# Patient Record
Sex: Female | Born: 1965 | Race: White | Hispanic: No | Marital: Married | Smoking: Never smoker
Health system: Southern US, Community
[De-identification: ages and names within clinical notes are randomized; demographics above are authoritative.]

## PROBLEM LIST (undated history)

## (undated) DIAGNOSIS — R600 Localized edema: Secondary | ICD-10-CM

## (undated) DIAGNOSIS — E669 Obesity, unspecified: Secondary | ICD-10-CM

## (undated) DIAGNOSIS — M2550 Pain in unspecified joint: Secondary | ICD-10-CM

## (undated) DIAGNOSIS — F419 Anxiety disorder, unspecified: Secondary | ICD-10-CM

## (undated) DIAGNOSIS — E162 Hypoglycemia, unspecified: Secondary | ICD-10-CM

## (undated) DIAGNOSIS — I341 Nonrheumatic mitral (valve) prolapse: Secondary | ICD-10-CM

## (undated) DIAGNOSIS — R079 Chest pain, unspecified: Secondary | ICD-10-CM

## (undated) DIAGNOSIS — I10 Essential (primary) hypertension: Secondary | ICD-10-CM

## (undated) DIAGNOSIS — M1990 Unspecified osteoarthritis, unspecified site: Secondary | ICD-10-CM

## (undated) DIAGNOSIS — E079 Disorder of thyroid, unspecified: Secondary | ICD-10-CM

## (undated) DIAGNOSIS — E559 Vitamin D deficiency, unspecified: Secondary | ICD-10-CM

## (undated) DIAGNOSIS — G4730 Sleep apnea, unspecified: Secondary | ICD-10-CM

## (undated) DIAGNOSIS — K829 Disease of gallbladder, unspecified: Secondary | ICD-10-CM

## (undated) DIAGNOSIS — C801 Malignant (primary) neoplasm, unspecified: Secondary | ICD-10-CM

## (undated) DIAGNOSIS — M47816 Spondylosis without myelopathy or radiculopathy, lumbar region: Secondary | ICD-10-CM

## (undated) HISTORY — DX: Disorder of thyroid, unspecified: E07.9

## (undated) HISTORY — PX: BREAST SURGERY: SHX581

## (undated) HISTORY — DX: Nonrheumatic mitral (valve) prolapse: I34.1

## (undated) HISTORY — PX: ABDOMINAL HYSTERECTOMY: SHX81

## (undated) HISTORY — DX: Malignant (primary) neoplasm, unspecified: C80.1

## (undated) HISTORY — DX: Unspecified osteoarthritis, unspecified site: M19.90

## (undated) HISTORY — DX: Chest pain, unspecified: R07.9

## (undated) HISTORY — DX: Sleep apnea, unspecified: G47.30

## (undated) HISTORY — DX: Anxiety disorder, unspecified: F41.9

## (undated) HISTORY — DX: Pain in unspecified joint: M25.50

## (undated) HISTORY — DX: Essential (primary) hypertension: I10

## (undated) HISTORY — DX: Disease of gallbladder, unspecified: K82.9

## (undated) HISTORY — PX: CHOLECYSTECTOMY: SHX55

## (undated) HISTORY — DX: Vitamin D deficiency, unspecified: E55.9

## (undated) HISTORY — DX: Obesity, unspecified: E66.9

## (undated) HISTORY — DX: Localized edema: R60.0

## (undated) HISTORY — DX: Hypoglycemia, unspecified: E16.2

---
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
## 2010-12-22 DIAGNOSIS — K8020 Calculus of gallbladder without cholecystitis without obstruction: Secondary | ICD-10-CM | POA: Insufficient documentation

## 2015-01-08 DIAGNOSIS — R5383 Other fatigue: Secondary | ICD-10-CM | POA: Insufficient documentation

## 2015-01-08 DIAGNOSIS — N951 Menopausal and female climacteric states: Secondary | ICD-10-CM | POA: Insufficient documentation

## 2015-01-08 DIAGNOSIS — E039 Hypothyroidism, unspecified: Secondary | ICD-10-CM | POA: Insufficient documentation

## 2015-01-08 DIAGNOSIS — Z6841 Body Mass Index (BMI) 40.0 and over, adult: Secondary | ICD-10-CM | POA: Insufficient documentation

## 2015-01-08 DIAGNOSIS — F988 Other specified behavioral and emotional disorders with onset usually occurring in childhood and adolescence: Secondary | ICD-10-CM | POA: Insufficient documentation

## 2015-02-05 DIAGNOSIS — Z2821 Immunization not carried out because of patient refusal: Secondary | ICD-10-CM | POA: Insufficient documentation

## 2016-08-28 ENCOUNTER — Other Ambulatory Visit (HOSPITAL_BASED_OUTPATIENT_CLINIC_OR_DEPARTMENT_OTHER): Payer: Self-pay | Admitting: Orthopedic Surgery

## 2016-08-28 DIAGNOSIS — S86301A Unspecified injury of muscle(s) and tendon(s) of peroneal muscle group at lower leg level, right leg, initial encounter: Secondary | ICD-10-CM

## 2016-08-29 ENCOUNTER — Ambulatory Visit (HOSPITAL_BASED_OUTPATIENT_CLINIC_OR_DEPARTMENT_OTHER)
Admission: RE | Admit: 2016-08-29 | Discharge: 2016-08-29 | Disposition: A | Discharge status: Home / Self Care | Payer: PRIVATE HEALTH INSURANCE | Source: Ambulatory Visit | Attending: Orthopedic Surgery | Admitting: Orthopedic Surgery

## 2016-08-29 DIAGNOSIS — M659 Synovitis and tenosynovitis, unspecified: Secondary | ICD-10-CM | POA: Diagnosis not present

## 2016-08-29 DIAGNOSIS — S86301A Unspecified injury of muscle(s) and tendon(s) of peroneal muscle group at lower leg level, right leg, initial encounter: Secondary | ICD-10-CM

## 2016-08-29 DIAGNOSIS — X58XXXA Exposure to other specified factors, initial encounter: Secondary | ICD-10-CM | POA: Diagnosis not present

## 2016-08-29 DIAGNOSIS — M722 Plantar fascial fibromatosis: Secondary | ICD-10-CM | POA: Insufficient documentation

## 2016-08-29 DIAGNOSIS — S92024A Nondisplaced fracture of anterior process of right calcaneus, initial encounter for closed fracture: Secondary | ICD-10-CM | POA: Diagnosis not present

## 2016-08-29 DIAGNOSIS — S86309A Unspecified injury of muscle(s) and tendon(s) of peroneal muscle group at lower leg level, unspecified leg, initial encounter: Secondary | ICD-10-CM | POA: Diagnosis present

## 2017-04-07 DIAGNOSIS — D0512 Intraductal carcinoma in situ of left breast: Secondary | ICD-10-CM | POA: Insufficient documentation

## 2017-07-29 DIAGNOSIS — M25549 Pain in joints of unspecified hand: Secondary | ICD-10-CM | POA: Insufficient documentation

## 2017-07-29 DIAGNOSIS — Z9189 Other specified personal risk factors, not elsewhere classified: Secondary | ICD-10-CM | POA: Insufficient documentation

## 2018-08-23 ENCOUNTER — Encounter: Payer: Self-pay | Admitting: Sports Medicine

## 2018-08-23 ENCOUNTER — Ambulatory Visit (INDEPENDENT_AMBULATORY_CARE_PROVIDER_SITE_OTHER): Payer: PRIVATE HEALTH INSURANCE | Admitting: Sports Medicine

## 2018-08-23 DIAGNOSIS — R29818 Other symptoms and signs involving the nervous system: Secondary | ICD-10-CM

## 2018-08-23 DIAGNOSIS — M25571 Pain in right ankle and joints of right foot: Secondary | ICD-10-CM | POA: Insufficient documentation

## 2018-08-23 DIAGNOSIS — Z853 Personal history of malignant neoplasm of breast: Secondary | ICD-10-CM | POA: Diagnosis not present

## 2018-08-23 DIAGNOSIS — R251 Tremor, unspecified: Secondary | ICD-10-CM

## 2018-08-23 DIAGNOSIS — E162 Hypoglycemia, unspecified: Secondary | ICD-10-CM

## 2018-08-23 DIAGNOSIS — G8929 Other chronic pain: Secondary | ICD-10-CM

## 2018-08-23 DIAGNOSIS — Z1211 Encounter for screening for malignant neoplasm of colon: Secondary | ICD-10-CM | POA: Insufficient documentation

## 2018-08-23 DIAGNOSIS — Z0000 Encounter for general adult medical examination without abnormal findings: Secondary | ICD-10-CM | POA: Insufficient documentation

## 2018-08-23 DIAGNOSIS — M170 Bilateral primary osteoarthritis of knee: Secondary | ICD-10-CM | POA: Insufficient documentation

## 2018-08-23 NOTE — Assessment & Plan Note (Signed)
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 Lateral heel wedge, continue meloxicam, rehab exercises given, ankle x-rays. Return in 2 to 3 weeks, we will do an peroneal sheath injection versus MRI if no better.

## 2018-08-23 NOTE — Assessment & Plan Note (Addendum)
Unclear etiology, she is not on any medications that could cause hypoglycemia, adding insulin and C-peptide levels, and these episodes were not with fasting.  Insulin levels, C-peptide levels, are all normal but in the upper limit of normal.  Proinsulin is slightly elevated, for this reason I would like endocrinology to weigh in regarding the possibility of an insulinoma considering levels were high normal and she had episodes of symptomatic hypoglycemia.

## 2018-08-23 NOTE — Assessment & Plan Note (Signed)
Had a steroid injection 2 years ago, did well, has never had Visco supplementation. Getting her approved again but this time for Orthovisc, baseline x-rays today. If approved for Orthovisc we would do a steroid and Orthovisc injection together, she tells me she does not need it just yet. Continue meloxicam, rehab exercises given.

## 2018-08-23 NOTE — Assessment & Plan Note (Signed)
Unclear etiology, she does describe episodes where her eyes rolled back, she shakes, she does get a bit sweaty. Has noted hypoglycemia into the 50s during these episodes. We are going to work-up her hyperglycemia but I also think that she needs an evaluation with neurology, with an EEG. She does endorse some new onset word finding difficulties, and does have a history of breast cancer so we will absolutely need a brain MRI with and without contrast.

## 2018-08-23 NOTE — Assessment & Plan Note (Signed)
I think gastric sleeve would be the best option for her.

## 2018-08-23 NOTE — Progress Notes (Addendum)
Subjective:    CC: New patient visit with the below complaints as noted in HPI:  HPI:  Shaking episodes: These occur relatively randomly, sometimes after eating, she endorses that her eyes will roll back in her head, she becomes minimally responsive, no tongue biting, no incontinence.  She gets very sweaty during these episodes.  She has had her blood sugar checked and it was in the 50s.  She does have a history of breast cancer, suspected to be stage I but has not had PET scan or any brain imaging.  In addition she has right ankle pain, she has a history of an ankle fracture, a small avulsion that healed.  More recently she is had worse pain in her ankle behind the lateral malleolus.  Moderate, persistent, localized without radiation.  No mechanical symptoms.  Bilateral knee osteoarthritis: Had a steroid injection 2 years with good relief, not really having a significant amount of pain right now but she is interested in considering Visco supplementation when her pain returns.  Morbid obesity: Is going to think about gastric sleeve.  I reviewed the past medical history, family history, social history, surgical history, and allergies today and no changes were needed.  Please see the problem list section below in epic for further details.  Past Medical History: Past Medical History:  Diagnosis Date  . Cancer ([place])   . Hypertension   . Thyroid disease    Past Surgical History: Past Surgical History:  Procedure Laterality Date  . ABDOMINAL HYSTERECTOMY    . BREAST SURGERY    . CHOLECYSTECTOMY     Social History: Social History   Socioeconomic History  . Marital status: Unknown    Spouse name: Not on file  . Number of children: Not on file  . Years of education: Not on file  . Highest education level: Not on file  Occupational History  . Not on file  Social Needs  . Financial resource strain: Not on file  . Food insecurity    Worry: Not on file    Inability: Not on file  .  Transportation needs    Medical: Not on file    Non-medical: Not on file  Tobacco Use  . Smoking status: Never Smoker  . Smokeless tobacco: Never Used  Substance and Sexual Activity  . Alcohol use: Never    Frequency: Never  . Drug use: Never  . Sexual activity: Never  Lifestyle  . Physical activity    Days per week: Not on file    Minutes per session: Not on file  . Stress: Not on file  Relationships  . Social Herbalist on phone: Not on file    Gets together: Not on file    Attends religious service: Not on file    Active member of club or organization: Not on file    Attends meetings of clubs or organizations: Not on file    Relationship status: Not on file  Other Topics Concern  . Not on file  Social History Narrative  . Not on file   Family History: Family History  Problem Relation Age of Onset  . Hypertension Mother   . Depression Mother   . Atrial fibrillation Mother   . Thyroid disease Mother   . Hyperlipidemia Mother    Allergies: No Known Allergies Medications: See med rec.  Review of Systems: No headache, visual changes, nausea, vomiting, diarrhea, constipation, dizziness, abdominal pain, skin rash, fevers, chills, night sweats, swollen lymph nodes,  weight loss, chest pain, body aches, joint swelling, muscle aches, shortness of breath, mood changes, visual or auditory hallucinations.  Objective:    General: Well Developed, well nourished, and in no acute distress.  Neuro: Alert and oriented x3, extra-ocular muscles intact, sensation grossly intact.  HEENT: Normocephalic, atraumatic, pupils equal round reactive to light, neck supple, no masses, no lymphadenopathy, thyroid nonpalpable.  Skin: Warm and dry, no rashes noted.  Cardiac: Regular rate and rhythm, no murmurs rubs or gallops.  Respiratory: Clear to auscultation bilaterally. Not using accessory muscles, speaking in full sentences.  Abdominal: Soft, nontender, nondistended, positive bowel  sounds, no masses, no organomegaly.  Musculoskeletal: Shoulder, elbow, wrist, hip, knee, ankle stable, and with full range of motion.  Impression and Recommendations:    The patient was counselled, risk factors were discussed, anticipatory guidance given.  Shaking Unclear etiology, she does describe episodes where her eyes rolled back, she shakes, she does get a bit sweaty. Has noted hypoglycemia into the 50s during these episodes. We are going to work-up her hyperglycemia but I also think that she needs an evaluation with neurology, with an EEG. She does endorse some new onset word finding difficulties, and does have a history of breast cancer so we will absolutely need a brain MRI with and without contrast.  Right ankle pain Suspect peroneal tendinitis. Lateral heel wedge, continue meloxicam, rehab exercises given, ankle x-rays. Return in 2 to 3 weeks, we will do an peroneal sheath injection versus MRI if no better.  Primary osteoarthritis of both knees Had a steroid injection 2 years ago, did well, has never had Visco supplementation. Getting her approved again but this time for Orthovisc, baseline x-rays today. If approved for Orthovisc we would do a steroid and Orthovisc injection together, she tells me she does not need it just yet. Continue meloxicam, rehab exercises given.  Hypoglycemia Unclear etiology, she is not on any medications that could cause hypoglycemia, adding insulin and C-peptide levels, and these episodes were not with fasting.  Insulin levels, C-peptide levels, are all normal but in the upper limit of normal.  Proinsulin is slightly elevated, for this reason I would like endocrinology to weigh in regarding the possibility of an insulinoma considering levels were high normal and she had episodes of symptomatic hypoglycemia.  Annual physical exam Up-to-date on most of her screening, adding Cologuard testing  Morbid obesity ([place]) I think gastric sleeve would be  the best option for her.    ___________________________________________ [name]. [name], M.D., ABFM., CAQSM. Primary Care and Sports Medicine [facility]  Adjunct Professor of [employer]

## 2018-08-23 NOTE — Assessment & Plan Note (Signed)
Up-to-date on most of her screening, adding Cologuard testing

## 2018-08-25 ENCOUNTER — Other Ambulatory Visit: Payer: Self-pay

## 2018-08-25 ENCOUNTER — Ambulatory Visit (INDEPENDENT_AMBULATORY_CARE_PROVIDER_SITE_OTHER): Payer: PRIVATE HEALTH INSURANCE

## 2018-08-25 DIAGNOSIS — G8929 Other chronic pain: Secondary | ICD-10-CM

## 2018-08-25 DIAGNOSIS — M25571 Pain in right ankle and joints of right foot: Secondary | ICD-10-CM | POA: Diagnosis not present

## 2018-08-25 DIAGNOSIS — M170 Bilateral primary osteoarthritis of knee: Secondary | ICD-10-CM

## 2018-09-01 ENCOUNTER — Telehealth: Payer: Self-pay | Admitting: Sports Medicine

## 2018-09-01 NOTE — Telephone Encounter (Signed)
-----   Message from Silverio Decamp, MD sent at 08/23/2018  2:49 PM EDT ----- Bilateral Orthovisc approval please

## 2018-09-01 NOTE — Telephone Encounter (Signed)
I called patients insurance and per Beverly Jackson the injections are approved as of today. The approval number is [id number] and its for the series of 4. Patient does not want to start at this time and will call the office when she wants to start the injections. I will have to call insurance each time the injection is given.

## 2018-09-02 NOTE — Addendum Note (Signed)
Addended by: Silverio Decamp on: 09/02/2018 05:06 PM   Modules accepted: Orders

## 2018-09-03 LAB — LIPID PANEL W/REFLEX DIRECT LDL
Cholesterol: 133 mg/dL (ref <200)
HDL: 54 mg/dL (ref >=50)
LDL Cholesterol (Calc): 63 mg/dL (calc)
Non-HDL Cholesterol (Calc): 79 mg/dL (calc) (ref <130)
Total CHOL/HDL Ratio: 2.5 (calc) (ref <5.0)
Triglycerides: 84 mg/dL (ref <150)

## 2018-09-03 LAB — COMPLETE METABOLIC PANEL WITH GFR
AG Ratio: 1.6 (calc) (ref 1.0–2.5)
ALT: 36 U/L — ABNORMAL HIGH (ref 6–29)
AST: 24 U/L (ref 10–35)
Albumin: 3.9 g/dL (ref 3.6–5.1)
Alkaline phosphatase (APISO): 70 U/L (ref 37–153)
BUN: 23 mg/dL (ref 7–25)
CO2: 26 mmol/L (ref 20–32)
Calcium: 9.3 mg/dL (ref 8.6–10.4)
Chloride: 108 mmol/L (ref 98–110)
Creat: 0.72 mg/dL (ref 0.50–1.05)
GFR, Est African American: 113 mL/min/{1.73_m2} (ref >=60)
GFR, Est Non African American: 98 mL/min/{1.73_m2} (ref >=60)
Globulin: 2.5 g/dL (calc) (ref 1.9–3.7)
Glucose, Bld: 93 mg/dL (ref 65–99)
Potassium: 4 mmol/L (ref 3.5–5.3)
Sodium: 141 mmol/L (ref 135–146)
Total Bilirubin: 0.3 mg/dL (ref 0.2–1.2)
Total Protein: 6.4 g/dL (ref 6.1–8.1)

## 2018-09-03 LAB — CBC
HCT: 37.4 % (ref 35.0–45.0)
Hemoglobin: 12.2 g/dL (ref 11.7–15.5)
MCH: 28.3 pg (ref 27.0–33.0)
MCHC: 32.6 g/dL (ref 32.0–36.0)
MCV: 86.8 fL (ref 80.0–100.0)
MPV: 10.4 fL (ref 7.5–12.5)
Platelets: 217 10*3/uL (ref 140–400)
RBC: 4.31 10*6/uL (ref 3.80–5.10)
RDW: 12.5 % (ref 11.0–15.0)
WBC: 4.8 10*3/uL (ref 3.8–10.8)

## 2018-09-03 LAB — INSULIN, RANDOM: Insulin: 19 u[IU]/mL

## 2018-09-03 LAB — TSH: TSH: 3.65 mIU/L

## 2018-09-03 LAB — HEMOGLOBIN A1C
Hgb A1c MFr Bld: 5.5 % of total Hgb (ref <5.7)
Mean Plasma Glucose: 111 (calc)
eAG (mmol/L): 6.2 (calc)

## 2018-09-03 LAB — PROINSULIN: Proinsulin: 18.3 pmol/L

## 2018-09-03 LAB — C-PEPTIDE: C-Peptide: 3.53 ng/mL (ref 0.80–3.85)

## 2018-09-03 LAB — VITAMIN D 25 HYDROXY (VIT D DEFICIENCY, FRACTURES): Vit D, 25-Hydroxy: 21 ng/mL — ABNORMAL LOW (ref 30–100)

## 2018-09-03 MED ORDER — VITAMIN D (ERGOCALCIFEROL) 1.25 MG (50000 UNIT) PO CAPS: 50000.0000 [IU] | ORAL_CAPSULE | ORAL | 0 refills | Status: DC

## 2018-09-03 NOTE — Addendum Note (Signed)
Addended by: Silverio Decamp on: 09/03/2018 07:35 AM   Modules accepted: Orders

## 2018-09-05 ENCOUNTER — Other Ambulatory Visit: Payer: Self-pay

## 2018-09-05 ENCOUNTER — Ambulatory Visit (INDEPENDENT_AMBULATORY_CARE_PROVIDER_SITE_OTHER): Payer: PRIVATE HEALTH INSURANCE

## 2018-09-05 DIAGNOSIS — R251 Tremor, unspecified: Secondary | ICD-10-CM

## 2018-09-05 DIAGNOSIS — R29818 Other symptoms and signs involving the nervous system: Secondary | ICD-10-CM

## 2018-09-05 MED ORDER — GADOBUTROL 1 MMOL/ML IV SOLN
10.0000 mL | Freq: Once | INTRAVENOUS | Status: AC | PRN
  Administered 2018-09-05: 10 mL via INTRAVENOUS

## 2018-09-14 ENCOUNTER — Encounter: Payer: Self-pay | Admitting: Sports Medicine

## 2018-09-27 ENCOUNTER — Other Ambulatory Visit: Payer: Self-pay

## 2018-09-27 ENCOUNTER — Ambulatory Visit (INDEPENDENT_AMBULATORY_CARE_PROVIDER_SITE_OTHER): Payer: PRIVATE HEALTH INSURANCE

## 2018-09-27 ENCOUNTER — Ambulatory Visit (INDEPENDENT_AMBULATORY_CARE_PROVIDER_SITE_OTHER): Payer: PRIVATE HEALTH INSURANCE | Admitting: Physician Assistant

## 2018-09-27 ENCOUNTER — Encounter: Payer: Self-pay | Admitting: Physician Assistant

## 2018-09-27 ENCOUNTER — Telehealth: Payer: Self-pay | Admitting: Physician Assistant

## 2018-09-27 VITALS — BP 119/80 | HR 89 | Temp 97.7°F | Wt 259.0 lb

## 2018-09-27 DIAGNOSIS — Z87442 Personal history of urinary calculi: Secondary | ICD-10-CM

## 2018-09-27 DIAGNOSIS — R1011 Right upper quadrant pain: Secondary | ICD-10-CM

## 2018-09-27 DIAGNOSIS — Z90710 Acquired absence of both cervix and uterus: Secondary | ICD-10-CM

## 2018-09-27 DIAGNOSIS — R3915 Urgency of urination: Secondary | ICD-10-CM

## 2018-09-27 DIAGNOSIS — R310 Gross hematuria: Secondary | ICD-10-CM

## 2018-09-27 DIAGNOSIS — R109 Unspecified abdominal pain: Secondary | ICD-10-CM | POA: Insufficient documentation

## 2018-09-27 DIAGNOSIS — R600 Localized edema: Secondary | ICD-10-CM

## 2018-09-27 LAB — POCT URINALYSIS DIPSTICK
Bilirubin, UA: NEGATIVE
Glucose, UA: NEGATIVE
Ketones, UA: NEGATIVE
Nitrite, UA: NEGATIVE
Protein, UA: NEGATIVE
Spec Grav, UA: 1.01 (ref 1.010–1.025)
Urobilinogen, UA: 0.2 E.U./dL
pH, UA: 5 (ref 5.0–8.0)

## 2018-09-27 NOTE — Telephone Encounter (Signed)
[place] and spoke with Tammy and no precert is required for CT renal stone study. Bonnita Nasuti notified to proceed with scan in Imaging. KG LPN

## 2018-09-27 NOTE — Progress Notes (Signed)
HPI:                                                                Beverly Jackson is a 53 y.o. female who presents to [facility] today for hematuria  Patient reports new onset intermittent hematuria approx 1 month ago. Notices pink on tissue paper after voiding. Associated with suprapubic pressure and urinary urgency. Developed new onset mid-back pain approx 5 days ago that waxes and wanes, occasionally stabbing, mostly dull ache. It is the same with all positions. Radiated to her RUQ once and woke her from sleep.  Denies dysuria, frequency. No hx of recurrent UTI or pyelonephritis. Reports being diagnosed with microscopic hematuria in early 2019 (no work-up). Reports hx of kidney stone > 20 years ago. Surgical hx significant for TAH for menorrhagia/benign ovarian cysts  Past Medical History:  Diagnosis Date  . Cancer ([place])   . Hypertension   . Thyroid disease    Past Surgical History:  Procedure Laterality Date  . ABDOMINAL HYSTERECTOMY    . BREAST SURGERY    . CHOLECYSTECTOMY     Social History   Tobacco Use  . Smoking status: Never Smoker  . Smokeless tobacco: Never Used  Substance Use Topics  . Alcohol use: Never    Frequency: Never   family history includes Atrial fibrillation in her mother; Depression in her mother; Hyperlipidemia in her mother; Hypertension in her mother; Thyroid disease in her mother.    ROS: negative except as noted in the HPI  Medications: Current Outpatient Medications  Medication Sig Dispense Refill  . furosemide (LASIX) 20 MG tablet Take 20 mg by mouth daily.    Marland Kitchen levothyroxine (SYNTHROID) 25 MCG tablet TAKE ONE TABLET BY MOUTH DAILY    . loratadine (CLARITIN) 10 MG tablet Take 10 mg by mouth every other day.    . meloxicam (MOBIC) 15 MG tablet TAKE 1 TABLET BY MOUTH ONCE DAILY    . spironolactone (ALDACTONE) 50 MG tablet Take 50 mg by mouth daily.    . tamoxifen (NOLVADEX) 20 MG tablet Take  20 mg by mouth daily.    . Vitamin D, Ergocalciferol, (DRISDOL) 1.25 MG (50000 UT) CAPS capsule Take 1 capsule (50,000 Units total) by mouth every 7 (seven) days. Take for 8 total doses(weeks) 8 capsule 0   No current facility-administered medications for this visit.    No Known Allergies     Objective:  BP 119/80   Pulse 89   Temp 97.7 F (36.5 C) (Oral)   Wt 259 lb (117.5 kg)   BMI 48.15 kg/m  Wt Readings from Last 3 Encounters:  09/27/18 259 lb (117.5 kg)  08/23/18 258 lb (117 kg)   Temp Readings from Last 3 Encounters:  09/27/18 97.7 F (36.5 C) (Oral)   BP Readings from Last 3 Encounters:  09/27/18 119/80  08/23/18 120/80   Pulse Readings from Last 3 Encounters:  09/27/18 89  08/23/18 89    Gen:  alert, not ill-appearing, no distress, appropriate for age, obese female HEENT: head normocephalic without obvious abnormality, conjunctiva and cornea clear, trachea midline Pulm: Normal work of breathing, normal phonation GI: abdomen soft, non-tender, exam limited due to body habitus, there is R CVA tenderness Neuro: alert  and oriented x 3, no tremor MSK: extremities atraumatic, normal gait and station, 1+ peripheral edema of left foot/ankle Skin: intact, no rashes on exposed skin, no jaundice, no cyanosis  Lab Results  Component Value Date   CREATININE 0.72 08/25/2018   BUN 23 08/25/2018   NA 141 08/25/2018   K 4.0 08/25/2018   CL 108 08/25/2018   CO2 26 08/25/2018     Results for orders placed or performed in visit on 09/27/18 (from the past 72 hour(s))  POCT Urinalysis Dipstick     Status: Abnormal   Collection Time: 09/27/18 10:13 AM  Result Value Ref Range   Color, UA yellow    Clarity, UA clear    Glucose, UA Negative Negative   Bilirubin, UA negative    Ketones, UA negative    Spec Grav, UA 1.010 1.010 - 1.025   Blood, UA trace    pH, UA 5.0 5.0 - 8.0   Protein, UA Negative Negative   Urobilinogen, UA 0.2 0.2 or 1.0 E.U./dL   Nitrite, UA  negative    Leukocytes, UA Moderate (2+) (A) Negative   Appearance     Odor     No results found.    Assessment and Plan: 53 y.o. female with   .Diagnoses and all orders for this visit:  Gross hematuria -     CT RENAL STONE STUDY  Urinary urgency -     POCT Urinalysis Dipstick -     Urine Culture -     Urine Microscopic; Future -     Urine Microscopic  Right upper quadrant abdominal pain -     CT RENAL STONE STUDY  Acute right flank pain -     CT RENAL STONE STUDY  History of nephrolithiasis  History of hysterectomy for benign disease    UA positive for trace blood and moderate leuks DDx includes nephrolithiasis, pyelonephritis, bladder mass CT stone study pending Urine micro and cx pending Renal function dated 08/25/18 reviewed and normal   Patient education and anticipatory guidance given Patient agrees with treatment plan Follow-up based on diagnostic testing  Darlyne Russian PA-C

## 2018-09-27 NOTE — Patient Instructions (Signed)
Hematuria, Adult Hematuria is blood in the urine. Blood may be visible in the urine, or it may be identified with a test. This condition can be caused by infections of the bladder, urethra, kidney, or prostate. Other possible causes include:  Kidney stones.  Cancer of the urinary tract.  Too much calcium in the urine.  Conditions that are passed from parent to child (inherited conditions).  Exercise that requires a lot of energy. Infections can usually be treated with medicine, and a kidney stone usually will pass through your urine. If neither of these is the cause of your hematuria, more tests may be needed to identify the cause of your symptoms. It is very important to tell your health care provider about any blood in your urine, even if it is painless or the blood stops without treatment. Blood in the urine, when it happens and then stops and then happens again, can be a symptom of a very serious condition, including cancer. There is no pain in the initial stages of many urinary cancers. Follow these instructions at home: Medicines  Take over-the-counter and prescription medicines only as told by your health care provider.  If you were prescribed an antibiotic medicine, take it as told by your health care provider. Do not stop taking the antibiotic even if you start to feel better. Eating and drinking  Drink enough fluid to keep your urine clear or pale yellow. It is recommended that you drink 3-4 quarts (2.8-3.8 L) a day. If you have been diagnosed with an infection, it is recommended that you drink cranberry juice in addition to large amounts of water.  Avoid caffeine, tea, and carbonated beverages. These tend to irritate the bladder.  Avoid alcohol because it may irritate the prostate (men). General instructions  If you have been diagnosed with a kidney stone, follow your health care provider's instructions about straining your urine to catch the stone.  Empty your bladder  often. Avoid holding urine for long periods of time.  If you are female: ? After a bowel movement, wipe from front to back and use each piece of toilet paper only once. ? Empty your bladder before and after sex.  Pay attention to any changes in your symptoms. Tell your health care provider about any changes or any new symptoms.  It is your responsibility to get your test results. Ask your health care provider, or the department performing the test, when your results will be ready.  Keep all follow-up visits as told by your health care provider. This is important. Contact a health care provider if:  You develop back pain.  You have a fever.  You have nausea or vomiting.  Your symptoms do not improve after 3 days.  Your symptoms get worse. Get help right away if:  You develop severe vomiting and are unable take medicine without vomiting.  You develop severe pain in your back or abdomen even though you are taking medicine.  You pass a large amount of blood in your urine.  You pass blood clots in your urine.  You feel very weak or like you might faint.  You faint. Summary  Hematuria is blood in the urine. It has many possible causes.  It is very important that you tell your health care provider about any blood in your urine, even if it is painless or the blood stops without treatment.  Take over-the-counter and prescription medicines only as told by your health care provider.  Drink enough fluid to keep   your urine clear or pale yellow. This information is not intended to replace advice given to you by your health care provider. Make sure you discuss any questions you have with your health care provider. Document Released: 02/09/2005 Document Revised: 01/22/2017 Document Reviewed: 03/14/2016 Elsevier Patient Education  2020 [employer].

## 2018-09-28 ENCOUNTER — Encounter: Payer: Self-pay | Admitting: Physician Assistant

## 2018-09-28 DIAGNOSIS — M47816 Spondylosis without myelopathy or radiculopathy, lumbar region: Secondary | ICD-10-CM

## 2018-09-28 HISTORY — DX: Spondylosis without myelopathy or radiculopathy, lumbar region: M47.816

## 2018-09-28 LAB — URINALYSIS, MICROSCOPIC ONLY
Bacteria, UA: NONE SEEN /HPF
Hyaline Cast: NONE SEEN /LPF

## 2018-09-29 LAB — URINE CULTURE
MICRO NUMBER:: 735080
SPECIMEN QUALITY:: ADEQUATE

## 2018-10-07 ENCOUNTER — Ambulatory Visit (INDEPENDENT_AMBULATORY_CARE_PROVIDER_SITE_OTHER): Payer: PRIVATE HEALTH INSURANCE | Admitting: Sports Medicine

## 2018-10-07 DIAGNOSIS — M47816 Spondylosis without myelopathy or radiculopathy, lumbar region: Secondary | ICD-10-CM | POA: Diagnosis not present

## 2018-10-07 DIAGNOSIS — G8929 Other chronic pain: Secondary | ICD-10-CM | POA: Diagnosis not present

## 2018-10-07 DIAGNOSIS — M25571 Pain in right ankle and joints of right foot: Secondary | ICD-10-CM | POA: Diagnosis not present

## 2018-10-07 NOTE — Assessment & Plan Note (Signed)
It seems as though sleeve gastrectomy is not covered by her insurance plan, I would like to refer her to 1 of the local weight loss clinics.

## 2018-10-07 NOTE — Assessment & Plan Note (Addendum)
Historically we suspected peroneal tendinitis, I added lateral heel wedges, she continues to complain of pain so we can certainly try peroneal sheath injection if no improvement at the follow-up visit.

## 2018-10-07 NOTE — Assessment & Plan Note (Signed)
Thoracolumbar DDD with facet arthritis. This is related to weakness in the paralumbar and parathoracic musculature as well as her weight. Continue arthritis strength Tylenol, meloxicam, adding formal physical therapy. We will do this for 6 weeks before considering an MRI of the thoracolumbar spine and epidural planning versus simply starting gabapentin.

## 2018-10-07 NOTE — Progress Notes (Signed)
Virtual Visit via WebEx/MyChart   I connected with  Beverly Jackson  on 10/07/18 via WebEx/MyChart/Doximity Video and verified that I am speaking with the correct person using two identifiers.   I discussed the limitations, risks, security and privacy concerns of performing an evaluation and management service by WebEx/MyChart/Doximity Video, including the higher likelihood of inaccurate diagnosis and treatment, and the availability of in person appointments.  We also discussed the likely need of an additional face to face encounter for complete and high quality delivery of care.  I also discussed with the patient that there may be a patient responsible charge related to this service. The patient expressed understanding and wishes to proceed.  Provider location is either at home or medical facility. Patient location is at their home, different from provider location. People involved in care of the patient during this telehealth encounter were myself, my nurse/medical assistant, and my front office/scheduling team member.  Subjective:    CC: Multiple issues  HPI: Back pain: Initially suspected to be nephrolithiasis, CT was negative for nephrolith, she did have a little bit of blood in her urine, no other abnormalities with the exception of multilevel lumbar DDD and facet arthritis, the DDD did extend to her thoracic spine as well.  Obesity: Bariatric surgery is a plan exclusion unfortunately.  Ankle pain: Clinically diagnosed as peroneal tendinitis, we have not further evaluated it since the initial visit.  I reviewed the past medical history, family history, social history, surgical history, and allergies today and no changes were needed.  Please see the problem list section below in epic for further details.  Past Medical History: Past Medical History:  Diagnosis Date  . Cancer ([place])   . 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  . Hypertension   . Thyroid disease    Past  Surgical History: Past Surgical History:  Procedure Laterality Date  . ABDOMINAL HYSTERECTOMY    . BREAST SURGERY    . CHOLECYSTECTOMY     Social History: Social History   Socioeconomic History  . Marital status: Unknown    Spouse name: Not on file  . Number of children: Not on file  . Years of education: Not on file  . Highest education level: Not on file  Occupational History  . Not on file  Social Needs  . Financial resource strain: Not on file  . Food insecurity    Worry: Not on file    Inability: Not on file  . Transportation needs    Medical: Not on file    Non-medical: Not on file  Tobacco Use  . Smoking status: Never Smoker  . Smokeless tobacco: Never Used  Substance and Sexual Activity  . Alcohol use: Never    Frequency: Never  . Drug use: Never  . Sexual activity: Never  Lifestyle  . Physical activity    Days per week: Not on file    Minutes per session: Not on file  . Stress: Not on file  Relationships  . Social Herbalist on phone: Not on file    Gets together: Not on file    Attends religious service: Not on file    Active member of club or organization: Not on file    Attends meetings of clubs or organizations: Not on file    Relationship status: Not on file  Other Topics Concern  . Not on file  Social History Narrative  . Not on file   Family History: Family History  Problem Relation Age of Onset  . Hypertension Mother   . Depression Mother   . Atrial fibrillation Mother   . Thyroid disease Mother   . Hyperlipidemia Mother    Allergies: No Known Allergies Medications: See med rec.  Review of Systems: No fevers, chills, night sweats, weight loss, chest pain, or shortness of breath.   Objective:    General: Speaking full sentences, no audible heavy breathing.  Sounds alert and appropriately interactive.  Appears well.  Face symmetric.  Extraocular movements intact.  Pupils equal and round.  No nasal flaring or accessory  muscle use visualized.  No other physical exam performed due to the non-physical nature of this visit.  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 This is related to weakness in the paralumbar and parathoracic musculature as well as her weight. Continue arthritis strength Tylenol, meloxicam, adding formal physical therapy. We will do this for 6 weeks before considering an MRI of the thoracolumbar spine and epidural planning versus simply starting gabapentin.  Right ankle pain Historically we suspected peroneal tendinitis, I added lateral heel wedges, she continues to complain of pain so we can certainly try peroneal sheath injection if no improvement at the follow-up visit.  Morbid obesity ([place]) It seems as though sleeve gastrectomy is not covered by her insurance plan, I would like to refer her to 1 of the local weight loss clinics.  I discussed the above assessment and treatment plan with the patient. The patient was provided an opportunity to ask questions and all were answered. The patient agreed with the plan and demonstrated an understanding of the instructions.   The patient was advised to call back or seek an in-person evaluation if the symptoms worsen or if the condition fails to improve as anticipated.   I provided 25 minutes of non-face-to-face time during this encounter, 15 minutes of additional time was needed to gather information, review chart, records, communicate/coordinate with staff remotely, troubleshooting the multiple errors that we get every time when trying to do video calls through the electronic medical record, WebEx, and Doximity, restart the encounter multiple times due to instability of the software, as well as complete documentation.   ___________________________________________ [name]. [name], M.D., ABFM., CAQSM. Primary Care and Sports Medicine [facility]   Adjunct Professor of [employer] [facility] [employer]

## 2018-10-27 ENCOUNTER — Other Ambulatory Visit: Payer: Self-pay | Admitting: Sports Medicine

## 2018-11-14 ENCOUNTER — Other Ambulatory Visit: Payer: Self-pay | Admitting: *Deleted

## 2018-11-14 MED ORDER — MELOXICAM 15 MG PO TABS: 15.0000 mg | ORAL_TABLET | Freq: Every day | ORAL | 3 refills | Status: DC

## 2018-11-18 ENCOUNTER — Other Ambulatory Visit: Payer: Self-pay

## 2018-11-18 ENCOUNTER — Ambulatory Visit (INDEPENDENT_AMBULATORY_CARE_PROVIDER_SITE_OTHER): Payer: PRIVATE HEALTH INSURANCE | Admitting: Family Medicine

## 2018-11-18 ENCOUNTER — Encounter: Payer: Self-pay | Admitting: Family Medicine

## 2018-11-18 VITALS — BP 121/82 | HR 92 | Wt 253.0 lb

## 2018-11-18 DIAGNOSIS — R0789 Other chest pain: Secondary | ICD-10-CM

## 2018-11-18 DIAGNOSIS — R079 Chest pain, unspecified: Secondary | ICD-10-CM

## 2018-11-18 DIAGNOSIS — F430 Acute stress reaction: Secondary | ICD-10-CM | POA: Diagnosis not present

## 2018-11-18 MED ORDER — SERTRALINE HCL 50 MG PO TABS: ORAL_TABLET | ORAL | 1 refills | Status: DC

## 2018-11-18 MED ORDER — CLONAZEPAM 0.5 MG PO TABS: 0.2500 mg | ORAL_TABLET | Freq: Two times a day (BID) | ORAL | 0 refills | Status: DC | PRN

## 2018-11-18 NOTE — Assessment & Plan Note (Signed)
Discussed options. Will start zoloft sine took that years ago. Will start new rx and f/U in 3 weeks. Given small supply of clonazapam for prn use. Warned about dependency potential of benzos.  Discussed therapy/counseling as an options.

## 2018-11-18 NOTE — Progress Notes (Signed)
Acute Office Visit  Subjective:    Patient ID: Beverly Jackson, female    DOB: 20-Dec-1965, 53 y.o.   MRN: [id number]  Chief Complaint  Patient presents with  . Chest Pain  . Stress    HPI Patient is in today for chest pain that started last night.  Her son was in a really bad car accident on August 15 and he is back at home after a 11-day stay in the ICU but he is unable to walk.  She still working as a Theme park manager and then coming home and helping him and assisting him since he is pretty much bedbound at this point.  She says last night was just a lot going on she was trying to dump his bedpan and they were just many things, not her all the same time and her chest started to hurt.  She describes it as a tightness.  She says it lasted all night long until she went to bed last night.  There were no worsening or alleviating factors.  She says today it is pretty much been there but it comes and goes in intensity.  She denies any cough, congestion, fevers chills or sweats.  No prior history of blood clot pulmonary embolism or DVT.  She has not been sleeping very well since the car accident in part because of stress and anxiety but also in part because she is getting up to help him in the middle the night.  Denies any abnormal swelling or tachycardia.  Her mother has a history of atrial fibrillation.  Says years ago around the time of her divorce she was on Zoloft for short period of time. She doesn't remember any problems with the medication. Thinks she had a reaction to Lexapro in the past.    Past Medical History:  Diagnosis Date  . Cancer ([place])   . 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  . Hypertension   . Thyroid disease     Past Surgical History:  Procedure Laterality Date  . ABDOMINAL HYSTERECTOMY    . BREAST SURGERY    . CHOLECYSTECTOMY      Family History  Problem Relation Age of Onset  . Hypertension Mother   . Depression Mother   . Atrial fibrillation Mother   .  Thyroid disease Mother   . Hyperlipidemia Mother     Social History   Socioeconomic History  . Marital status: Unknown    Spouse name: Not on file  . Number of children: Not on file  . Years of education: Not on file  . Highest education level: Not on file  Occupational History  . Occupation: Hair Dresser  Social Needs  . Financial resource strain: Not on file  . Food insecurity    Worry: Not on file    Inability: Not on file  . Transportation needs    Medical: Not on file    Non-medical: Not on file  Tobacco Use  . Smoking status: Never Smoker  . Smokeless tobacco: Never Used  Substance and Sexual Activity  . Alcohol use: Never    Frequency: Never  . Drug use: Never  . Sexual activity: Never  Lifestyle  . Physical activity    Days per week: Not on file    Minutes per session: Not on file  . Stress: Not on file  Relationships  . Social Herbalist on phone: Not on file    Gets together: Not on file    Attends  religious service: Not on file    Active member of club or organization: Not on file    Attends meetings of clubs or organizations: Not on file    Relationship status: Not on file  . Intimate partner violence    Fear of current or ex partner: Not on file    Emotionally abused: Not on file    Physically abused: Not on file    Forced sexual activity: Not on file  Other Topics Concern  . 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 furosemide (LASIX) 20 MG tablet Take 20 mg by mouth daily.    Marland Kitchen levothyroxine (SYNTHROID) 25 MCG tablet TAKE ONE TABLET BY MOUTH DAILY    . loratadine (CLARITIN) 10 MG tablet Take 10 mg by mouth every other day.    . meloxicam (MOBIC) 15 MG tablet Take 1 tablet (15 mg total) by mouth daily. 30 tablet 3  . spironolactone (ALDACTONE) 50 MG tablet Take 50 mg by mouth daily.    . tamoxifen (NOLVADEX) 20 MG  tablet Take 20 mg by mouth daily.    . Vitamin D, Ergocalciferol, (DRISDOL) 1.25 MG (50000 UT) CAPS capsule Take 1 capsule (50,000 Units total) by mouth every 7 (seven) days. Take for 8 total doses(weeks) 8 capsule 0   No facility-administered medications prior to visit.     No Known Allergies  ROS     Objective:    Physical Exam  Constitutional: She is oriented to person, place, and time. She appears well-developed and well-nourished.  HENT:  Head: Normocephalic and atraumatic.  Cardiovascular: Normal rate, regular rhythm and normal heart sounds.  Pulmonary/Chest: Effort normal and breath sounds normal.  Neurological: She is alert and oriented to person, place, and time.  Skin: Skin is warm and dry.  Psychiatric: She has a normal mood and affect. Her behavior is normal.    BP 121/82   Pulse 92   Wt 253 lb (114.8 kg)   SpO2 98%   BMI 47.03 kg/m  Wt Readings from Last 3 Encounters:  11/18/18 253 lb (114.8 kg)  09/27/18 259 lb (117.5 kg)  08/23/18 258 lb (117 kg)    Health Maintenance Due  Topic Date Due  . TETANUS/TDAP  10/29/1986  . COLONOSCOPY  10/28/2017  . INFLUENZA VACCINE  09/24/2018    There are no preventive care reminders to display for this patient.   Lab Results  Component Value Date   TSH 3.65 08/25/2018   Lab Results  Component Value Date   WBC 4.8 08/25/2018   HGB 12.2 08/25/2018   HCT 37.4 08/25/2018   MCV 86.8 08/25/2018   PLT 217 08/25/2018   Lab Results  Component Value Date   NA 141 08/25/2018   K 4.0 08/25/2018   CO2 26 08/25/2018   GLUCOSE 93 08/25/2018   BUN 23 08/25/2018   CREATININE 0.72 08/25/2018   BILITOT 0.3 08/25/2018   AST 24 08/25/2018   ALT 36 (H) 08/25/2018   PROT 6.4 08/25/2018   CALCIUM 9.3 08/25/2018   Lab Results  Component Value Date   CHOL 133 08/25/2018   Lab Results  Component Value Date   HDL 54 08/25/2018   Lab Results  Component Value Date   LDLCALC 63 08/25/2018   Lab Results  Component  Value Date   TRIG 84 08/25/2018   Lab Results  Component Value  Date   CHOLHDL 2.5 08/25/2018   Lab Results  Component Value Date   HGBA1C 5.5 08/25/2018       Assessment & Plan:   Problem List Items Addressed This Visit      Other   Acute stress reaction    Discussed options. Will start zoloft sine took that years ago. Will start new rx and f/U in 3 weeks. Given small supply of clonazapam for prn use. Warned about dependency potential of benzos.  Discussed therapy/counseling as an options.        Relevant Medications   sertraline (ZOLOFT) 50 MG tablet    Other Visit Diagnoses    Chest pain, unspecified type    -  Primary   Relevant Orders   EKG 12-Lead   COMPLETE METABOLIC PANEL WITH GFR   CBC   TSH   Troponin I   Atypical chest pain       Relevant Orders   COMPLETE METABOLIC PANEL WITH GFR   CBC   TSH   Troponin I     Atypical Chest Pain - EKG is normal. NSR, with no acute changes.  Pain is atypical. She is overall low risk for CAD. Suspect inc stress but will check for anemia, electrolye abnormality. Evaluate thyroid ( she is on medication). Will check Trop as well. Will call with results once available.      Meds ordered this encounter  Medications  . sertraline (ZOLOFT) 50 MG tablet    Sig: 1/2 tab po QD x 6 days then increase to whole tab daily    Dispense:  30 tablet    Refill:  1  . clonazePAM (KLONOPIN) 0.5 MG tablet    Sig: Take 0.5-1 tablets (0.25-0.5 mg total) by mouth 2 (two) times daily as needed for anxiety.    Dispense:  20 tablet    Refill:  0     Beatrice Lecher, MD

## 2018-11-19 LAB — CBC
HCT: 39.7 % (ref 35.0–45.0)
Hemoglobin: 13.1 g/dL (ref 11.7–15.5)
MCH: 28.2 pg (ref 27.0–33.0)
MCHC: 33 g/dL (ref 32.0–36.0)
MCV: 85.6 fL (ref 80.0–100.0)
MPV: 10.8 fL (ref 7.5–12.5)
Platelets: 231 10*3/uL (ref 140–400)
RBC: 4.64 10*6/uL (ref 3.80–5.10)
RDW: 12.3 % (ref 11.0–15.0)
WBC: 6.9 10*3/uL (ref 3.8–10.8)

## 2018-11-19 LAB — COMPLETE METABOLIC PANEL WITH GFR
AG Ratio: 1.6 (calc) (ref 1.0–2.5)
ALT: 39 U/L — ABNORMAL HIGH (ref 6–29)
AST: 31 U/L (ref 10–35)
Albumin: 4.1 g/dL (ref 3.6–5.1)
Alkaline phosphatase (APISO): 75 U/L (ref 37–153)
BUN: 22 mg/dL (ref 7–25)
CO2: 20 mmol/L (ref 20–32)
Calcium: 9.3 mg/dL (ref 8.6–10.4)
Chloride: 105 mmol/L (ref 98–110)
Creat: 0.95 mg/dL (ref 0.50–1.05)
GFR, Est African American: 80 mL/min/{1.73_m2} (ref >=60)
GFR, Est Non African American: 69 mL/min/{1.73_m2} (ref >=60)
Globulin: 2.5 g/dL (calc) (ref 1.9–3.7)
Glucose, Bld: 135 mg/dL (ref 65–139)
Potassium: 4 mmol/L (ref 3.5–5.3)
Sodium: 140 mmol/L (ref 135–146)
Total Bilirubin: 0.3 mg/dL (ref 0.2–1.2)
Total Protein: 6.6 g/dL (ref 6.1–8.1)

## 2018-11-19 LAB — TROPONIN I: Troponin I: <0.01 ng/mL (ref <=0.0)

## 2018-11-19 LAB — TSH: TSH: 2.17 mIU/L

## 2018-11-28 NOTE — Addendum Note (Signed)
Addended by: Alena Bills R on: 11/28/2018 01:24 PM   Modules accepted: Orders

## 2019-01-14 ENCOUNTER — Other Ambulatory Visit: Payer: Self-pay | Admitting: Family Medicine

## 2019-01-17 ENCOUNTER — Other Ambulatory Visit: Payer: Self-pay | Admitting: *Deleted

## 2019-01-17 MED ORDER — LEVOTHYROXINE SODIUM 25 MCG PO TABS: 25.0000 ug | ORAL_TABLET | Freq: Every day | ORAL | 1 refills | Status: DC

## 2019-02-08 ENCOUNTER — Other Ambulatory Visit: Payer: Self-pay | Admitting: Sports Medicine

## 2019-02-12 ENCOUNTER — Other Ambulatory Visit: Payer: Self-pay | Admitting: Sports Medicine

## 2019-02-14 ENCOUNTER — Other Ambulatory Visit: Payer: Self-pay | Admitting: Sports Medicine

## 2019-02-14 MED ORDER — SPIRONOLACTONE 50 MG PO TABS: 50.0000 mg | ORAL_TABLET | Freq: Every day | ORAL | 3 refills | Status: DC

## 2019-02-14 MED ORDER — FUROSEMIDE 20 MG PO TABS: 20.0000 mg | ORAL_TABLET | Freq: Every day | ORAL | 3 refills | Status: DC

## 2019-03-06 ENCOUNTER — Encounter (INDEPENDENT_AMBULATORY_CARE_PROVIDER_SITE_OTHER): Payer: Self-pay | Admitting: Bariatrics

## 2019-03-06 ENCOUNTER — Other Ambulatory Visit: Payer: Self-pay

## 2019-03-06 ENCOUNTER — Other Ambulatory Visit: Payer: Self-pay | Admitting: Sports Medicine

## 2019-03-06 ENCOUNTER — Ambulatory Visit (INDEPENDENT_AMBULATORY_CARE_PROVIDER_SITE_OTHER): Payer: PRIVATE HEALTH INSURANCE | Admitting: Bariatrics

## 2019-03-06 VITALS — BP 128/81 | HR 76 | Temp 99.1°F | Ht 62.0 in | Wt 253.0 lb

## 2019-03-06 DIAGNOSIS — E66813 Obesity, class 3: Secondary | ICD-10-CM

## 2019-03-06 DIAGNOSIS — Z9189 Other specified personal risk factors, not elsewhere classified: Secondary | ICD-10-CM

## 2019-03-06 DIAGNOSIS — E038 Other specified hypothyroidism: Secondary | ICD-10-CM | POA: Diagnosis not present

## 2019-03-06 DIAGNOSIS — M170 Bilateral primary osteoarthritis of knee: Secondary | ICD-10-CM

## 2019-03-06 DIAGNOSIS — R5383 Other fatigue: Secondary | ICD-10-CM | POA: Diagnosis not present

## 2019-03-06 DIAGNOSIS — R0602 Shortness of breath: Secondary | ICD-10-CM | POA: Diagnosis not present

## 2019-03-06 DIAGNOSIS — Z1331 Encounter for screening for depression: Secondary | ICD-10-CM

## 2019-03-06 DIAGNOSIS — Z6841 Body Mass Index (BMI) 40.0 and over, adult: Secondary | ICD-10-CM

## 2019-03-06 DIAGNOSIS — E559 Vitamin D deficiency, unspecified: Secondary | ICD-10-CM

## 2019-03-06 DIAGNOSIS — Z0289 Encounter for other administrative examinations: Secondary | ICD-10-CM

## 2019-03-06 DIAGNOSIS — R600 Localized edema: Secondary | ICD-10-CM

## 2019-03-06 DIAGNOSIS — E162 Hypoglycemia, unspecified: Secondary | ICD-10-CM

## 2019-03-06 DIAGNOSIS — Z853 Personal history of malignant neoplasm of breast: Secondary | ICD-10-CM

## 2019-03-07 ENCOUNTER — Encounter (INDEPENDENT_AMBULATORY_CARE_PROVIDER_SITE_OTHER): Payer: Self-pay | Admitting: Bariatrics

## 2019-03-07 DIAGNOSIS — R7401 Elevation of levels of liver transaminase levels: Secondary | ICD-10-CM | POA: Insufficient documentation

## 2019-03-07 DIAGNOSIS — E559 Vitamin D deficiency, unspecified: Secondary | ICD-10-CM | POA: Insufficient documentation

## 2019-03-07 DIAGNOSIS — R7303 Prediabetes: Secondary | ICD-10-CM | POA: Insufficient documentation

## 2019-03-07 LAB — COMPREHENSIVE METABOLIC PANEL
ALT: 34 IU/L — ABNORMAL HIGH (ref 0–32)
AST: 29 IU/L (ref 0–40)
Albumin/Globulin Ratio: 1.7 (ref 1.2–2.2)
Albumin: 4.4 g/dL (ref 3.8–4.9)
Alkaline Phosphatase: 97 IU/L (ref 39–117)
BUN/Creatinine Ratio: 28 — ABNORMAL HIGH (ref 9–23)
BUN: 21 mg/dL (ref 6–24)
Bilirubin Total: 0.3 mg/dL (ref 0.0–1.2)
CO2: 23 mmol/L (ref 20–29)
Calcium: 9.5 mg/dL (ref 8.7–10.2)
Chloride: 101 mmol/L (ref 96–106)
Creatinine, Ser: 0.74 mg/dL (ref 0.57–1.00)
GFR calc Af Amer: 108 mL/min/{1.73_m2} (ref >59)
GFR calc non Af Amer: 94 mL/min/{1.73_m2} (ref >59)
Globulin, Total: 2.6 g/dL (ref 1.5–4.5)
Glucose: 88 mg/dL (ref 65–99)
Potassium: 4 mmol/L (ref 3.5–5.2)
Sodium: 141 mmol/L (ref 134–144)
Total Protein: 7 g/dL (ref 6.0–8.5)

## 2019-03-07 LAB — LIPID PANEL WITH LDL/HDL RATIO
Cholesterol, Total: 167 mg/dL (ref 100–199)
HDL: 57 mg/dL (ref >39)
LDL Chol Calc (NIH): 89 mg/dL (ref 0–99)
LDL/HDL Ratio: 1.6 ratio (ref 0.0–3.2)
Triglycerides: 116 mg/dL (ref 0–149)
VLDL Cholesterol Cal: 21 mg/dL (ref 5–40)

## 2019-03-07 LAB — T4, FREE: Free T4: 1.33 ng/dL (ref 0.82–1.77)

## 2019-03-07 LAB — VITAMIN D 25 HYDROXY (VIT D DEFICIENCY, FRACTURES): Vit D, 25-Hydroxy: 21.3 ng/mL — ABNORMAL LOW (ref 30.0–100.0)

## 2019-03-07 LAB — T3: T3, Total: 146 ng/dL (ref 71–180)

## 2019-03-07 LAB — VITAMIN B12: Vitamin B-12: 530 pg/mL (ref 232–1245)

## 2019-03-07 LAB — HEMOGLOBIN A1C
Est. average glucose Bld gHb Est-mCnc: 120 mg/dL
Hgb A1c MFr Bld: 5.8 % — ABNORMAL HIGH (ref 4.8–5.6)

## 2019-03-07 LAB — INSULIN, RANDOM: INSULIN: 29.4 u[IU]/mL — ABNORMAL HIGH (ref 2.6–24.9)

## 2019-03-07 LAB — TSH: TSH: 2.74 u[IU]/mL (ref 0.450–4.500)

## 2019-03-07 NOTE — Progress Notes (Signed)
Dear Dr. Aundria Mems,   Thank you for referring Beverly Jackson to our clinic. The following note includes my evaluation and treatment recommendations.  Chief Complaint:   OBESITY Beverly Jackson (MR# [id number]) is a 54 y.o. female who presents for evaluation and treatment of obesity and related comorbidities. Current BMI is Body mass index is 46.27 kg/m.Marland Kitchen Beverly Jackson has been struggling with her weight for many years and has been unsuccessful in either losing weight, maintaining weight loss, or reaching her healthy weight goal.  Beverly Jackson states she is currently in the action stage of change and ready to dedicate time achieving and maintaining a healthier weight. Beverly Jackson is interested in becoming our patient and working on intensive lifestyle modifications including (but not limited to) diet and exercise for weight loss.  Beverly Jackson's habits were reviewed today and are as follows: Her family eats meals together, she thinks her family will eat healthier with her, her desired weight loss is 123 lbs, she has been heavy most of her life, she started gaining weight after having children and a divorce, her heaviest weight ever was 254 pounds, she skips lunch at work sometimes, she frequently makes poor food choices, she sometimes has problems with excessive hunger and she struggles with emotional eating.  Depression Screen Beverly Jackson Food and Mood (modified PHQ-9) score was 19.  Depression screen PHQ 2/9 03/06/2019  Decreased Interest 2  Down, Depressed, Hopeless 3  PHQ - 2 Score 5  Altered sleeping 1  Tired, decreased energy 3  Change in appetite 2  Feeling bad or failure about yourself  3  Trouble concentrating 2  Moving slowly or fidgety/restless 3  Suicidal thoughts 0  PHQ-9 Score 19  Difficult doing work/chores Not difficult at all   Subjective:   Other fatigue. Beverly Jackson feels her energy is lower than it should be. This has worsened with weight gain and has worsened recently. Beverly Jackson denies daytime somnolence and  admits to waking up still tired. Patient is at risk for obstructive sleep apnea. Patient generally gets 6-8 hours of sleep per night, and states they generally have restful sleep. Snoring is present. Apneic episodes are present. Epworth Sleepiness Score is 10.  Shortness of breath on exertion. Beverly Jackson notes increasing shortness of breath with exercising and seems to be worsening over time with weight gain. She notes getting out of breath sooner with activity than she used to. This has gotten worse recently. Beverly Jackson denies shortness of breath at rest or orthopnea.  Other specified hypothyroidism. Beverly Jackson takes Synthroid and reports levels are normal.  Vitamin D deficiency. Beverly Jackson is taking Vitamin D.  Osteoarthritis of both knees, unspecified osteoarthritis type. Marland KitchenAmy has osteoarthritis of her knees and feet, which limits how far she can walk. She had "rooster comb" injections in the past, which were helpful.   Edema of right lower extremity. Beverly Jackson takes lasix and spironolactone. No blood pressure issues.  History of breast cancer. Beverly Jackson was diagnosed with breast cancer in March 2019 and takes tamoxifen. She reports fatigue.  Hypoglycemia. Beverly Jackson is at increased risk for hypoglycemia due to changes in diet, diagnosis of diabetes, and/or insulin use. Beverly Jackson is not not currently taking insulin. She occasionally skips meals. Denies personal or family history of diabetes mellitus.  Depression screening.   At risk for diabetes mellitus. Beverly Jackson is at higher than average risk for developing diabetes due to her obesity.   Assessment/Plan:   Other fatigue.  Beverly Jackson was informed fatigue may be related to obesity, depression or many other causes.  Labs will be ordered, and in the meanwhile Beverly Jackson has agreed to work on diet, exercise and weight loss. EKG 12-Lead was done.  Shortness of breath on exertion. Beverly Jackson's shortness of breath appears to be obesity related and exercise induced. She has agreed to work on weight loss and gradually  increase exercise to treat her exercise induced shortness of breath. Will continue to monitor closely. Lipid Panel With LDL/HDL Ratio were ordered.  Other specified hypothyroidism. Patient with long-standing hypothyroidism, on levothyroxine therapy. She appears euthyroid. Orders and follow up as documented in patient record. T3, T4, free, TSH ordered.  Counseling . Good thyroid control is important for overall health. Beverly Jackson. . The correct way to take levothyroxine is fasting, with water, separated by at least 30 minutes from breakfast, and separated by more than 4 hours from calcium, iron, multivitamins, acid reflux medications (PPIs).    Vitamin D deficiency. Low Vitamin D level contributes to fatigue and are associated with obesity, breast, and colon cancer. She agrees to continue taking Vitamin D and will have routine testing of vitamin D today. B12, Vitamin D (25 hydroxy) ordered.  Osteoarthritis of both knees, unspecified osteoarthritis type. Beverly Jackson will work on weight loss and will see her orthopedist.  Edema of right lower extremity. Beverly Jackson will continue her medications.  History of breast cancer. Beverly Jackson will continue tamoxifen.   Hypoglycemia. Beverly Jackson will pack her lunch, eat carbohydrates and protein, and will not go long periods of time without eating more than 6 hours.  , HgB A1c,and random insulin ordered.   Depression screening.  At risk for diabetes mellitus. Beverly Jackson was given approximately 15 minutes of diabetes education and counseling today. We discussed intensive lifestyle modifications today with an emphasis on weight loss as well as increasing exercise and decreasing simple carbohydrates in her diet. We also reviewed medication options with an emphasis on risk versus benefit of those discussed.   Class 3 severe obesity with serious comorbidity and body mass index (BMI) of 45.0 to 49.9 in adult, unspecified  obesity type (Beverly Jackson).  Beverly Jackson is currently in the action stage of change and her goal is to continue with weight loss efforts. I recommend Beverly Jackson begin the structured treatment plan as follows:  She has agreed to on the Category 2 Plan.   She will work on meal planning, intentional eating, and will pack her lunch.  CMP and and Lipid Panel ordered.   We reviewed labs (CMP, CBC, glucose).  We discussed the following exercise goals today: For substantial health benefits, adults should do at least 150 minutes (2 hours and 30 minutes) a week of moderate-intensity, or 75 minutes (1 hour and 15 minutes) a week of vigorous-intensity aerobic physical activity, or an equivalent combination of moderate- and vigorous-intensity aerobic activity. Aerobic activity should be performed in episodes of at least 10 minutes, and preferably, it should be spread throughout the week. Adults should also include muscle-strengthening activities that involve all major muscle groups on 2 or more days a week.   We discussed the following behavioral modification strategies today: increasing lean protein intake, decreasing simple carbohydrates, increasing vegetables, increasing water intake, decreasing eating out, no skipping meals, meal planning and cooking strategies, keeping healthy foods in the home, ways to avoid night time snacking, better snacking choices and planning for success.  She was informed of the importance of frequent follow-up visits to maximize her success with intensive lifestyle modifications for her multiple health conditions.  She was informed we would discuss her lab Jackson at her next visit unless there is a critical issue that needs to be addressed sooner. Beverly Jackson agreed to keep her next visit at the agreed upon time to discuss these Jackson.  Objective:   Blood pressure 128/81, pulse 76, temperature 99.1 F (37.3 C), temperature source Oral, height 5\' 2"  (1.575 m), weight 253 lb (114.8 kg), SpO2 97 %. Body mass  index is 46.27 kg/m.  EKG: Sinus  Rhythm with a rate of 80 BPM. Low voltage in precordial leads. Poor R-wave progression -may be secondary to body habitus - Nonspecific T-abnormality. Otherwise normal.  Indirect Calorimeter completed today shows a VO2 of 235 and a REE of 1637.  Her calculated basal metabolic rate is [id number] thus her basal metabolic rate is worse than expected.  General: Cooperative, alert, well developed, in no acute distress. HEENT: Conjunctivae and lids unremarkable. Neck: No thyromegaly.  Cardiovascular: Regular rhythm.  Lungs: Normal work of breathing. Extremities: Trace edema in lower extremities. Neurologic: No focal deficits.   Lab Jackson  Component Value Date   CREATININE 0.74 03/06/2019   BUN 21 03/06/2019   NA 141 03/06/2019   K 4.0 03/06/2019   CL 101 03/06/2019   CO2 23 03/06/2019   Lab Jackson  Component Value Date   ALT 34 (H) 03/06/2019   AST 29 03/06/2019   ALKPHOS 97 03/06/2019   BILITOT 0.3 03/06/2019   Lab Jackson  Component Value Date   HGBA1C 5.8 (H) 03/06/2019   HGBA1C 5.5 08/25/2018   Lab Jackson  Component Value Date   INSULIN 29.4 (H) 03/06/2019   Lab Jackson  Component Value Date   TSH 2.740 03/06/2019   Lab Jackson  Component Value Date   CHOL 167 03/06/2019   HDL 57 03/06/2019   LDLCALC 89 03/06/2019   TRIG 116 03/06/2019   CHOLHDL 2.5 08/25/2018   Lab Jackson  Component Value Date   WBC 6.9 11/18/2018   HGB 13.1 11/18/2018   HCT 39.7 11/18/2018   MCV 85.6 11/18/2018   PLT 231 11/18/2018   No Jackson found for: IRON, TIBC, FERRITIN  Attestation Statements:   Reviewed by clinician on day of visit: allergies, medications, problem list, medical history, surgical history, family history, social history, and previous encounter notes.  Migdalia Dk, am acting as Location manager for [employer], DO  I have reviewed the above documentation for accuracy and completeness, and I agree with the above. Jearld Lesch,  DO

## 2019-03-08 ENCOUNTER — Encounter (INDEPENDENT_AMBULATORY_CARE_PROVIDER_SITE_OTHER): Payer: Self-pay | Admitting: Bariatrics

## 2019-03-11 ENCOUNTER — Other Ambulatory Visit: Payer: Self-pay | Admitting: Sports Medicine

## 2019-03-20 ENCOUNTER — Ambulatory Visit (INDEPENDENT_AMBULATORY_CARE_PROVIDER_SITE_OTHER): Payer: Self-pay | Admitting: Family Medicine

## 2019-03-27 ENCOUNTER — Encounter (INDEPENDENT_AMBULATORY_CARE_PROVIDER_SITE_OTHER): Payer: Self-pay | Admitting: Family Medicine

## 2019-03-27 ENCOUNTER — Ambulatory Visit (INDEPENDENT_AMBULATORY_CARE_PROVIDER_SITE_OTHER): Payer: PRIVATE HEALTH INSURANCE | Admitting: Family Medicine

## 2019-03-27 ENCOUNTER — Other Ambulatory Visit: Payer: Self-pay

## 2019-03-27 VITALS — BP 113/77 | HR 71 | Temp 98.2°F | Ht 62.0 in | Wt 248.0 lb

## 2019-03-27 DIAGNOSIS — R7303 Prediabetes: Secondary | ICD-10-CM

## 2019-03-27 DIAGNOSIS — E559 Vitamin D deficiency, unspecified: Secondary | ICD-10-CM | POA: Diagnosis not present

## 2019-03-27 DIAGNOSIS — Z6841 Body Mass Index (BMI) 40.0 and over, adult: Secondary | ICD-10-CM

## 2019-03-27 DIAGNOSIS — R7401 Elevation of levels of liver transaminase levels: Secondary | ICD-10-CM

## 2019-03-27 DIAGNOSIS — E038 Other specified hypothyroidism: Secondary | ICD-10-CM | POA: Diagnosis not present

## 2019-03-27 DIAGNOSIS — E162 Hypoglycemia, unspecified: Secondary | ICD-10-CM

## 2019-03-27 MED ORDER — ACCU-CHEK AVIVA PLUS VI STRP: ORAL_STRIP | 0 refills | Status: DC

## 2019-03-27 MED ORDER — ACCU-CHEK SAFE-T PRO LANCETS MISC: 12 refills | Status: DC

## 2019-03-27 MED ORDER — BLOOD GLUCOSE MONITOR KIT: PACK | 0 refills | Status: DC

## 2019-03-28 MED ORDER — VITAMIN D (ERGOCALCIFEROL) 1.25 MG (50000 UNIT) PO CAPS: 50000.0000 [IU] | ORAL_CAPSULE | ORAL | 0 refills | Status: DC

## 2019-03-28 NOTE — Progress Notes (Signed)
Chief Complaint:   OBESITY Destinie is here to discuss her progress with her obesity treatment plan along with follow-up of her obesity related diagnoses. Tamilyn is on the Category 2 Plan and states she is following her eating plan approximately 85% of the time. Bree states she is exercising for 0 minutes 0 times per week.  Today's visit was #: 2 Starting weight: 253 lbs Starting date: 03/06/2019 Today's weight: 248 lbs Today's date: 03/28/2019 Total lbs lost to date: 5 lbs Total lbs lost since last in-office visit: 5 lbs  Interim History: Lucita has had some hypoglycemic episodes since her last visit.  Subjective:   1. Other specified hypothyroidism Joua is taking levothyroxine 25 mcg daily.  Lab Results  Component Value Date   TSH 2.740 03/06/2019   2. Vitamin D deficiency Tammye's Vitamin D level was 21.3 on 03/06/2019. She is currently taking vit D. She denies nausea, vomiting or muscle weakness.  3. Prediabetes Susette has a diagnosis of prediabetes based on her elevated HgA1c and was informed this puts her at greater risk of developing diabetes. She continues to work on diet and exercise to decrease her risk of diabetes. She denies nausea or hypoglycemia.  Lab Results  Component Value Date   HGBA1C 5.8 (H) 03/06/2019   Lab Results  Component Value Date   INSULIN 29.4 (H) 03/06/2019   4. Elevated ALT measurement Maidie has elevated ALT. Her BMI is over 40. She denies abdominal pain or jaundice and has never been told of any liver problems in the past. She denies excessive alcohol intake.  Lab Results  Component Value Date   ALT 34 (H) 03/06/2019   AST 29 03/06/2019   ALKPHOS 97 03/06/2019   BILITOT 0.3 03/06/2019   5. Hypoglycemia Lusine has noted several hypoglycemic events.  During one, her blood glucose was 50, during another it was 110.  Assessment/Plan:   1. Other specified hypothyroidism Patient with long-standing hypothyroidism, on levothyroxine therapy. She appears  euthyroid. Orders and follow up as documented in patient record.  Counseling . Good thyroid control is important for overall health. Supratherapeutic thyroid levels are dangerous and will not improve weight loss results. . The correct way to take levothyroxine is fasting, with water, separated by at least 30 minutes from breakfast, and separated by more than 4 hours from calcium, iron, multivitamins, acid reflux medications (PPIs).   2. Vitamin D deficiency Low Vitamin D level contributes to fatigue and are associated with obesity, breast, and colon cancer. She agrees to continue to take prescription Vitamin D @50 ,000 IU every week and will follow-up for routine testing of Vitamin D, at least 2-3 times per year to avoid over-replacement.  3. Prediabetes Smrithi will continue to work on weight loss, exercise, and decreasing simple carbohydrates to help decrease the risk of diabetes.   Orders - Lancets (ACCU-CHEK SAFE-T PRO) lancets; Twice daily  Dispense: 100 each; Refill: 12 - glucose blood (ACCU-CHEK AVIVA PLUS) test strip; Twice daily  Dispense: 100 each; Refill: 0 - blood glucose meter kit and supplies KIT; Dispense based on patient and insurance preference. Use up to two times daily as directed. (FOR ICD-9 250.00, 250.01).  Dispense: 1 each; Refill: 0  4. Elevated ALT measurement We discussed the likely diagnosis of non-alcoholic fatty liver disease today and how this condition is obesity related. Raeven was educated the importance of weight loss. Damary agreed to continue with her weight loss efforts with healthier diet and exercise as an essential part  of her treatment plan.  5. Hypoglycemia Due to high insulin levels. Grayson was instructed to eat every 2-3 hours to avoid hypoglycemia. She will focus on protein with each meal. I sent in a prescription for a glucose monitor so that she may check her blood sugars. We will consider using Metformin in the near future.   6. Class 3 severe obesity with  serious comorbidity and body mass index (BMI) of 45.0 to 49.9 in adult, unspecified obesity type ([place]) Bently is currently in the action stage of change. As such, her goal is to continue with weight loss efforts. She has agreed to the Category 2 Plan.   Exercise goals: No exercise has been prescribed at this time.  Behavioral modification strategies: increasing lean protein intake, decreasing simple carbohydrates and increasing vegetables.  She will eat every 2 hours and monitor her CBGs.  Marycruz has agreed to follow-up with our clinic in 2 weeks. She was informed of the importance of frequent follow-up visits to maximize her success with intensive lifestyle modifications for her multiple health conditions.   Objective:   Blood pressure 113/77, pulse 71, temperature 98.2 F (36.8 C), temperature source Oral, height 5' 2"  (1.575 m), weight 248 lb (112.5 kg), SpO2 96 %. Body mass index is 45.36 kg/m.  General: Cooperative, alert, well developed, in no acute distress. HEENT: Conjunctivae and lids unremarkable. Cardiovascular: Regular rhythm.  Lungs: Normal work of breathing. Neurologic: No focal deficits.   Lab Results  Component Value Date   CREATININE 0.74 03/06/2019   BUN 21 03/06/2019   NA 141 03/06/2019   K 4.0 03/06/2019   CL 101 03/06/2019   CO2 23 03/06/2019   Lab Results  Component Value Date   ALT 34 (H) 03/06/2019   AST 29 03/06/2019   ALKPHOS 97 03/06/2019   BILITOT 0.3 03/06/2019   Lab Results  Component Value Date   HGBA1C 5.8 (H) 03/06/2019   HGBA1C 5.5 08/25/2018   Lab Results  Component Value Date   INSULIN 29.4 (H) 03/06/2019   Lab Results  Component Value Date   TSH 2.740 03/06/2019   Lab Results  Component Value Date   CHOL 167 03/06/2019   HDL 57 03/06/2019   LDLCALC 89 03/06/2019   TRIG 116 03/06/2019   CHOLHDL 2.5 08/25/2018   Lab Results  Component Value Date   WBC 6.9 11/18/2018   HGB 13.1 11/18/2018   HCT 39.7 11/18/2018   MCV 85.6  11/18/2018   PLT 231 11/18/2018   Attestation Statements:   Reviewed by clinician on day of visit: allergies, medications, problem list, medical history, surgical history, family history, social history, and previous encounter notes.  During the visit, I independently reviewed the patient's EKG, bioimpedance scale results, and indirect calorimeter results. I performed a medically necessary appropriate examination and/or evaluation. I discussed the assessment and treatment plan with the patient. The patient was provided an opportunity to ask questions and all were answered. The patient agreed with the plan and demonstrated an understanding of the instructions. Clinical information was updated and documented in the EMR. Time spent on visit including pre-visit chart review and post-visit care was 45 minutes.  I, Water quality scientist, CMA, am acting as Location manager for [employer], DO.  I have reviewed the above documentation for accuracy and completeness, and I agree with the above. Briscoe Deutscher, DO

## 2019-04-06 ENCOUNTER — Other Ambulatory Visit: Payer: Self-pay

## 2019-04-06 ENCOUNTER — Encounter: Payer: Self-pay | Admitting: Nurse Practitioner

## 2019-04-06 ENCOUNTER — Ambulatory Visit (INDEPENDENT_AMBULATORY_CARE_PROVIDER_SITE_OTHER): Payer: PRIVATE HEALTH INSURANCE | Admitting: Nurse Practitioner

## 2019-04-06 VITALS — BP 132/78 | HR 74 | Temp 97.7°F | Ht 62.0 in | Wt 251.0 lb

## 2019-04-06 DIAGNOSIS — B029 Zoster without complications: Secondary | ICD-10-CM

## 2019-04-06 MED ORDER — GABAPENTIN 100 MG PO CAPS: 100.0000 mg | ORAL_CAPSULE | Freq: Three times a day (TID) | ORAL | 3 refills | Status: DC

## 2019-04-06 MED ORDER — VALACYCLOVIR HCL 1 G PO TABS: 1000.0000 mg | ORAL_TABLET | Freq: Three times a day (TID) | ORAL | 0 refills | Status: AC

## 2019-04-06 NOTE — Patient Instructions (Signed)
Shingles  Shingles is an infection. It gives you a painful skin rash and blisters that have fluid in them. Shingles is caused by the same germ (virus) that causes chickenpox. Shingles only happens in people who:  Have had chickenpox.  Have been given a shot of medicine (vaccine) to protect against chickenpox. Shingles is rare in this group. The first symptoms of shingles may be itching, tingling, or pain in an area on your skin. A rash will show on your skin a few days or weeks later. The rash is likely to be on one side of your body. The rash usually has a shape like a belt or a band. Over time, the rash turns into fluid-filled blisters. The blisters will break open, change into scabs, and dry up. Medicines may:  Help with pain and itching.  Help you get better sooner.  Help to prevent long-term problems. Follow these instructions at home: Medicines  Take over-the-counter and prescription medicines only as told by your doctor.  Put on an anti-itch cream or numbing cream where you have a rash, blisters, or scabs. Do this as told by your doctor. Helping with itching and discomfort   Put cold, wet cloths (cold compresses) on the area of the rash or blisters as told by your doctor.  Cool baths can help you feel better. Try adding baking soda or dry oatmeal to the water to lessen itching. Do not bathe in hot water. Blister and rash care  Keep your rash covered with a loose bandage (dressing).  Wear loose clothing that does not rub on your rash.  Keep your rash and blisters clean. To do this, wash the area with mild soap and cool water as told by your doctor.  Check your rash every day for signs of infection. Check for: ? More redness, swelling, or pain. ? Fluid or blood. ? Warmth. ? Pus or a bad smell.  Do not scratch your rash. Do not pick at your blisters. To help you to not scratch: ? Keep your fingernails clean and cut short. ? Wear gloves or mittens when you sleep, if  scratching is a problem. General instructions  Rest as told by your doctor.  Keep all follow-up visits as told by your doctor. This is important.  Wash your hands often with soap and water. If soap and water are not available, use hand sanitizer. Doing this lowers your chance of getting a skin infection caused by germs (bacteria).  Your infection can cause chickenpox in people who have never had chickenpox or never got a shot of chickenpox vaccine. If you have blisters that did not change into scabs yet, try not to touch other people or be around other people, especially: ? Babies. ? Pregnant women. ? Children who have areas of red, itchy, or rough skin (eczema). ? Very old people who have transplants. ? People who have a long-term (chronic) sickness, like cancer or AIDS. Contact a doctor if:  Your pain does not get better with medicine.  Your pain does not get better after the rash heals.  You have any signs of infection in the rash area. These signs include: ? More redness, swelling, or pain around the rash. ? Fluid or blood coming from the rash. ? The rash area feeling warm to the touch. ? Pus or a bad smell coming from the rash. Get help right away if:  The rash is on your face or nose.  You have pain in your face or pain by   your eye.  You lose feeling on one side of your face.  You have trouble seeing.  You have ear pain, or you have ringing in your ear.  You have a loss of taste.  Your condition gets worse. Summary  Shingles gives you a painful skin rash and blisters that have fluid in them.  Shingles is an infection. It is caused by the same germ (virus) that causes chickenpox.  Keep your rash covered with a loose bandage (dressing). Wear loose clothing that does not rub on your rash.  If you have blisters that did not change into scabs yet, try not to touch other people or be around people. This information is not intended to replace advice given to you by  your health care provider. Make sure you discuss any questions you have with your health care provider. Document Revised: 06/03/2018 Document Reviewed: 10/14/2016   Valacyclovir caplets What is this medicine? VALACYCLOVIR (val ay SYE kloe veer) is an antiviral medicine. It is used to treat or prevent infections caused by certain kinds of viruses. Examples of these infections include herpes and shingles. This medicine will not cure herpes. This medicine may be used for other purposes; ask your health care provider or pharmacist if you have questions. COMMON BRAND NAME(S): Valtrex What should I tell my health care provider before I take this medicine? They need to know if you have any of these conditions:  acquired immunodeficiency syndrome (AIDS)  any other condition that may weaken the immune system  bone marrow or kidney transplant  kidney disease  an unusual or allergic reaction to valacyclovir, acyclovir, ganciclovir, valganciclovir, other medicines, foods, dyes, or preservatives  pregnant or trying to get pregnant  breast-feeding How should I use this medicine? Take this medicine by mouth with a glass of water. Follow the directions on the prescription label. You can take this medicine with or without food. Take your doses at regular intervals. Do not take your medicine more often than directed. Finish the full course prescribed by your doctor or health care professional even if you think your condition is better. Do not stop taking except on the advice of your doctor or health care professional. Talk to your pediatrician regarding the use of this medicine in children. While this drug may be prescribed for children as young as 2 years for selected conditions, precautions do apply. Overdosage: If you think you have taken too much of this medicine contact a poison control center or emergency room at once. NOTE: This medicine is only for you. Do not share this medicine with  others. What if I miss a dose? If you miss a dose, take it as soon as you can. If it is almost time for your next dose, take only that dose. Do not take double or extra doses. What may interact with this medicine? Do not take this medicine with any of the following medications:  cidofovir This medicine may also interact with the following medications:  adefovir  amphotericin B  certain antibiotics like amikacin, gentamicin, tobramycin, vancomycin  cimetidine  cisplatin  colistin  cyclosporine  foscarnet  lithium  methotrexate  probenecid  tacrolimus This list may not describe all possible interactions. Give your health care provider a list of all the medicines, herbs, non-prescription drugs, or dietary supplements you use. Also tell them if you smoke, drink alcohol, or use illegal drugs. Some items may interact with your medicine. What should I watch for while using this medicine? Tell your doctor or  health care professional if your symptoms do not start to get better after 1 week. This medicine works best when taken [place] in the course of an infection, within the first 53 hours. Begin treatment as soon as possible after the first signs of infection like tingling, itching, or pain in the affected area. It is possible that genital herpes may still be spread even when you are not having symptoms. Always use safer sex practices like condoms made of latex or polyurethane whenever you have sexual contact. You should stay well hydrated while taking this medicine. Drink plenty of fluids. What side effects may I notice from receiving this medicine? Side effects that you should report to your doctor or health care professional as soon as possible:  allergic reactions like skin rash, itching or hives, swelling of the face, lips, or tongue  aggressive behavior  confusion  hallucinations  problems with balance, talking, walking  stomach pain  tremor  trouble passing urine  or change in the amount of urine Side effects that usually do not require medical attention (report to your doctor or health care professional if they continue or are bothersome):  dizziness  headache  nausea, vomiting This list may not describe all possible side effects. Call your doctor for medical advice about side effects. You may report side effects to FDA at 1-800-FDA-1088. Where should I keep my medicine? Keep out of the reach of children. Store at room temperature between 15 and 25 degrees C (59 and 77 degrees F). Keep container tightly closed. Throw away any unused medicine after the expiration date. NOTE: This sheet is a summary. It may not cover all possible information. If you have questions about this medicine, talk to your doctor, pharmacist, or health care provider.  2020 Elsevier/Gold Standard (2018-03-08 12:22:33)  Elsevier Patient Education  [employer].
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
## 2019-04-09 ENCOUNTER — Other Ambulatory Visit: Payer: Self-pay | Admitting: Nurse Practitioner

## 2019-04-09 DIAGNOSIS — B029 Zoster without complications: Secondary | ICD-10-CM

## 2019-04-10 ENCOUNTER — Ambulatory Visit (INDEPENDENT_AMBULATORY_CARE_PROVIDER_SITE_OTHER): Payer: PRIVATE HEALTH INSURANCE | Admitting: Family Medicine

## 2019-04-16 ENCOUNTER — Other Ambulatory Visit: Payer: Self-pay | Admitting: Sports Medicine

## 2019-04-23 ENCOUNTER — Other Ambulatory Visit (INDEPENDENT_AMBULATORY_CARE_PROVIDER_SITE_OTHER): Payer: Self-pay | Admitting: Family Medicine

## 2019-04-23 DIAGNOSIS — E559 Vitamin D deficiency, unspecified: Secondary | ICD-10-CM

## 2019-05-15 ENCOUNTER — Other Ambulatory Visit: Payer: Self-pay

## 2019-05-15 ENCOUNTER — Ambulatory Visit (INDEPENDENT_AMBULATORY_CARE_PROVIDER_SITE_OTHER): Payer: PRIVATE HEALTH INSURANCE | Admitting: Sports Medicine

## 2019-05-15 ENCOUNTER — Telehealth: Payer: Self-pay | Admitting: Sports Medicine

## 2019-05-15 DIAGNOSIS — M170 Bilateral primary osteoarthritis of knee: Secondary | ICD-10-CM | POA: Diagnosis not present

## 2019-05-15 DIAGNOSIS — H6592 Unspecified nonsuppurative otitis media, left ear: Secondary | ICD-10-CM | POA: Diagnosis not present

## 2019-05-15 MED ORDER — PREDNISONE 50 MG PO TABS: 50.0000 mg | ORAL_TABLET | Freq: Every day | ORAL | 0 refills | Status: DC

## 2019-05-15 MED ORDER — MELOXICAM 15 MG PO TABS: 15.0000 mg | ORAL_TABLET | Freq: Every day | ORAL | 3 refills | Status: DC

## 2019-05-15 MED ORDER — PHENYLEPHRINE HCL 10 MG PO TABS: 10.0000 mg | ORAL_TABLET | Freq: Three times a day (TID) | ORAL | 0 refills | Status: DC

## 2019-05-15 MED ORDER — OXYMETAZOLINE HCL 0.05 % NA SOLN: 1.0000 | Freq: Two times a day (BID) | NASAL | 0 refills | Status: AC

## 2019-05-15 NOTE — Telephone Encounter (Signed)
Please work on bilateral Orthovisc approval please, please call patient when approved. X-ray confirmed, has taken NSAIDs, failed steroid injections.

## 2019-05-15 NOTE — Assessment & Plan Note (Signed)
We are going to try again to get her approved for viscosupplementation, she had a several year response when done previously with an outside orthopedist. Return to see me for viscosupplementation injections when approved.

## 2019-05-15 NOTE — Addendum Note (Signed)
Addended by: Jamesetta So on: 05/15/2019 03:58 PM   Modules accepted: Orders

## 2019-05-15 NOTE — Assessment & Plan Note (Signed)
There is evidence of middle ear effusion on the left with no evidence of otitis media. Continue Flonase, She will use Afrin for 5 days. If no improvement we will add prednisone And phenylephrine orally. If this fails, we will refer for tube placement.

## 2019-05-15 NOTE — Progress Notes (Signed)
    Procedures performed today:    None.  Independent interpretation of notes and tests performed by another provider:   None.  Impression and Recommendations:    Middle ear effusion, left There is evidence of middle ear effusion on the left with no evidence of otitis media. Continue Flonase, She will use Afrin for 5 days. If no improvement we will add prednisone And phenylephrine orally. If this fails, we will refer for tube placement.  Primary osteoarthritis of both knees We are going to try again to get her approved for viscosupplementation, she had a several year response when done previously with an outside orthopedist. Return to see me for viscosupplementation injections when approved.    ___________________________________________ [name]. [name], M.D., ABFM., CAQSM. Primary Care and [place] Instructor of [place] [employer] [facility] of Medicine

## 2019-05-17 NOTE — Telephone Encounter (Signed)
Went into [facility] and started case today.  [phone]...Marland KitchenMarland KitchenWB

## 2019-05-22 ENCOUNTER — Telehealth: Payer: Self-pay | Admitting: Sports Medicine

## 2019-05-22 NOTE — Telephone Encounter (Signed)
I received Benefits investigation detail from [facility] and a PA had to be obtained by the provider they would not release any information to 3rd Party.   I called [employer] and spoke with Linna Hoff.  Orthorvisc is approved   Approval # [id number] Valid: 05/22/2019 - 03/25/2020.   I called patient and left a voicemail to call me back to give her all information. - CF

## 2019-05-24 ENCOUNTER — Other Ambulatory Visit: Payer: Self-pay | Admitting: Sports Medicine

## 2019-06-07 ENCOUNTER — Telehealth: Payer: Self-pay | Admitting: *Deleted

## 2019-06-07 ENCOUNTER — Other Ambulatory Visit: Payer: Self-pay | Admitting: *Deleted

## 2019-06-07 DIAGNOSIS — G4733 Obstructive sleep apnea (adult) (pediatric): Secondary | ICD-10-CM

## 2019-06-07 NOTE — Telephone Encounter (Signed)
Pt left vm needing a new rx for cpap & supplies sent to [facility].  She said that hers is 52 yrs old.  I don't see an old order in her chart nor a sleep study.

## 2019-06-08 DIAGNOSIS — G4733 Obstructive sleep apnea (adult) (pediatric): Secondary | ICD-10-CM | POA: Insufficient documentation

## 2019-06-08 NOTE — Telephone Encounter (Signed)
I spoke with patient and explained her benefits she will call and schedule if she decides to get injections. - CF

## 2019-06-08 NOTE — Telephone Encounter (Signed)
Order sent.

## 2019-06-08 NOTE — Assessment & Plan Note (Signed)
Order for new CPAP and supplies sent to [facility], current system is over 52 years old.

## 2019-06-12 ENCOUNTER — Other Ambulatory Visit: Payer: Self-pay | Admitting: Sports Medicine

## 2019-09-06 ENCOUNTER — Other Ambulatory Visit: Payer: Self-pay | Admitting: Sports Medicine

## 2019-09-14 ENCOUNTER — Encounter: Payer: Self-pay | Admitting: Medical-Surgical

## 2019-09-14 ENCOUNTER — Ambulatory Visit (INDEPENDENT_AMBULATORY_CARE_PROVIDER_SITE_OTHER): Payer: PRIVATE HEALTH INSURANCE | Admitting: Medical-Surgical

## 2019-09-14 VITALS — BP 140/82 | HR 78 | Temp 98.0°F | Ht 62.0 in | Wt 260.0 lb

## 2019-09-14 DIAGNOSIS — R5383 Other fatigue: Secondary | ICD-10-CM

## 2019-09-14 DIAGNOSIS — M2550 Pain in unspecified joint: Secondary | ICD-10-CM

## 2019-09-14 DIAGNOSIS — R2231 Localized swelling, mass and lump, right upper limb: Secondary | ICD-10-CM

## 2019-09-14 NOTE — Progress Notes (Signed)
Subjective:    CC: profound fatigue  HPI: Pleasant 54 year old female presenting with reports of profound fatigue for the past week. 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 Yesterday she reports gong home and straight to bed. Yesterday she also had a migrain. Notes that she has been experiencing dizziness when she lays her head down and turns over. She is also having periods of "seeing stars". Sleeps well using her C-PAP and is rested in the morning. Energy starts winding down after a few hours and by 3pm her stamina is gone. She has had no medication or activity changes. Endorses occasional chronic palpitations. Does not eat or drink after others. Endorses multiple joint and muscle aches. Notes that ears have been bothering her with fluid on them and she was told she may have to have ear tubes at some point.  Has a small nodule on the palmar surface of the right hand at the MCP joint of the index finger. The area is not painful and she has had not difficulty with range of motion to the right index finger.   I reviewed the past medical history, family history, social history, surgical history, and allergies today and no changes were needed.  Please see the problem list section below in epic for further details.  Past Medical History: Past Medical History:  Diagnosis Date  . Anxiety   . Cancer ([place])   . Chest pain   . 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  . Edema, lower extremity   . Gallbladder disease   . Hypertension   . Joint pain   . Low blood sugar   . MVP (mitral valve prolapse)   . Obesity   . Osteoarthritis   . Sleep apnea   . Thyroid disease   . Vitamin D deficiency    Past Surgical History: Past Surgical History:  Procedure Laterality Date  . ABDOMINAL HYSTERECTOMY    . BREAST SURGERY    . CESAREAN SECTION    . CHOLECYSTECTOMY     Social History: Social History   Socioeconomic History  . Marital status:  Unknown    Spouse name: phillip  . Number of children: Not on file  . Years of education: Not on file  . Highest education level: Not on file  Occupational History  . 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 Smoking status: Never Smoker  . Smokeless tobacco: Never Used  Substance and Sexual Activity  . Alcohol use: Never  . Drug use: Never  . Sexual activity: Never  Other Topics Concern  . 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 Difficulty of Paying Living Expenses:   Food Insecurity:   . Worried About Charity fundraiser in the Last Year:   . Arboriculturist in the Last Year:   Transportation Needs:   . Film/video editor (Medical):   Marland Kitchen Lack of Transportation (Non-Medical):   Physical Activity:   . Days of Exercise per Week:   . Minutes of Exercise per Session:   Stress:   . Feeling of Stress :   Social Connections:   . Frequency of Communication with Friends and Family:   . Frequency of Social Gatherings with Friends and Family:   . Attends Religious Services:   .  Active Member of Clubs or Organizations:   . Attends Archivist Meetings:   Marland Kitchen Marital Status:    Family History: Family History  Problem Relation Age of Onset  . Hypertension Mother   . Depression Mother   . Atrial fibrillation Mother   . Thyroid disease Mother   . Hyperlipidemia Mother   . Stroke Mother   . Cancer Mother   . Obesity Mother   . Cancer Father    Allergies: Allergies  Allergen Reactions  . Mixed Ragweed Itching       . Penicillins Hives, Itching and Rash       . Short Ragweed Pollen Ext Itching   Medications: See med rec.  Review of Systems: No fevers, chills, night sweats, weight loss, chest pain, or shortness of breath.   Objective:    General: Well Developed, well nourished, and in no acute  distress.  Neuro: Alert and oriented x3, extra-ocular muscles intact, sensation grossly intact.  HEENT: Normocephalic, atraumatic, pupils equal round reactive to light, neck supple, no masses, no lymphadenopathy, thyroid nonpalpable. Bilateral serous fluid noted behind TMs.  Skin: Warm and dry. Cardiac: Regular rate and rhythm, no murmurs rubs or gallops, no lower extremity edema.  Respiratory: Clear to auscultation bilaterally. Not using accessory muscles, speaking in full sentences.  Impression and Recommendations:    1. Profound fatigue/dizziness. Checking CBC, CMP, TSH, CK, and ESR. Intermittent dizziness with head position changes likely related to vertigo and middle ear effusions. Advised to trial Meclizine that she has at home, try half a tab if the whole tab causes too much sedation. - CBC - COMPLETE METABOLIC PANEL WITH GFR - TSH - CK - Sed Rate (ESR)  2. Arthralgia, unspecified joint With multiple joint/muscle aches and fatigue, checking ESR to evaluate for PMR. - Sed Rate (ESR)  3. Nodule of finger of right hand Possibly related to trigger finger. No functional limitation at this point. Advised that if this occurs, there are injections that can help. She will follow up with Dr. Darene Lamer for this.  Return if symptoms worsen or fail to improve. ___________________________________________ Clearnce Sorrel, DNP, APRN, FNP-BC Primary Care and [place]

## 2019-09-15 LAB — CK: Total CK: 68 U/L (ref 29–143)

## 2019-09-15 LAB — COMPLETE METABOLIC PANEL WITH GFR
AG Ratio: 1.5 (calc) (ref 1.0–2.5)
ALT: 28 U/L (ref 6–29)
AST: 22 U/L (ref 10–35)
Albumin: 4.3 g/dL (ref 3.6–5.1)
Alkaline phosphatase (APISO): 85 U/L (ref 37–153)
BUN: 24 mg/dL (ref 7–25)
CO2: 24 mmol/L (ref 20–32)
Calcium: 9.7 mg/dL (ref 8.6–10.4)
Chloride: 106 mmol/L (ref 98–110)
Creat: 0.87 mg/dL (ref 0.50–1.05)
GFR, Est African American: 89 mL/min/{1.73_m2} (ref >=60)
GFR, Est Non African American: 77 mL/min/{1.73_m2} (ref >=60)
Globulin: 2.8 g/dL (calc) (ref 1.9–3.7)
Glucose, Bld: 84 mg/dL (ref 65–139)
Potassium: 4.5 mmol/L (ref 3.5–5.3)
Sodium: 139 mmol/L (ref 135–146)
Total Bilirubin: 0.3 mg/dL (ref 0.2–1.2)
Total Protein: 7.1 g/dL (ref 6.1–8.1)

## 2019-09-15 LAB — CBC
HCT: 41.4 % (ref 35.0–45.0)
Hemoglobin: 13.5 g/dL (ref 11.7–15.5)
MCH: 28.7 pg (ref 27.0–33.0)
MCHC: 32.6 g/dL (ref 32.0–36.0)
MCV: 87.9 fL (ref 80.0–100.0)
MPV: 10.9 fL (ref 7.5–12.5)
Platelets: 230 10*3/uL (ref 140–400)
RBC: 4.71 10*6/uL (ref 3.80–5.10)
RDW: 12.4 % (ref 11.0–15.0)
WBC: 6.4 10*3/uL (ref 3.8–10.8)

## 2019-09-15 LAB — SEDIMENTATION RATE: Sed Rate: 2 mm/h (ref 0–30)

## 2019-09-15 LAB — TSH: TSH: 3.06 mIU/L

## 2019-09-18 ENCOUNTER — Encounter: Payer: Self-pay | Admitting: Sports Medicine

## 2019-09-18 ENCOUNTER — Other Ambulatory Visit: Payer: Self-pay

## 2019-09-18 ENCOUNTER — Ambulatory Visit (INDEPENDENT_AMBULATORY_CARE_PROVIDER_SITE_OTHER): Payer: PRIVATE HEALTH INSURANCE | Admitting: Sports Medicine

## 2019-09-18 DIAGNOSIS — Z0000 Encounter for general adult medical examination without abnormal findings: Secondary | ICD-10-CM | POA: Diagnosis not present

## 2019-09-18 DIAGNOSIS — M170 Bilateral primary osteoarthritis of knee: Secondary | ICD-10-CM | POA: Diagnosis not present

## 2019-09-18 DIAGNOSIS — Z6841 Body Mass Index (BMI) 40.0 and over, adult: Secondary | ICD-10-CM

## 2019-09-18 DIAGNOSIS — R5383 Other fatigue: Secondary | ICD-10-CM

## 2019-09-18 MED ORDER — WEGOVY 0.25 MG/0.5ML ~~LOC~~ SOAJ: 0.2500 mg | SUBCUTANEOUS | 0 refills | Status: DC

## 2019-09-18 MED ORDER — LEVOTHYROXINE SODIUM 50 MCG PO TABS: 50.0000 ug | ORAL_TABLET | Freq: Every day | ORAL | 11 refills | Status: DC

## 2019-09-18 MED ORDER — SERTRALINE HCL 100 MG PO TABS: 100.0000 mg | ORAL_TABLET | Freq: Every day | ORAL | 11 refills | Status: DC

## 2019-09-18 MED ORDER — WEGOVY 1 MG/0.5ML ~~LOC~~ SOAJ: 1.0000 mg | SUBCUTANEOUS | 0 refills | Status: DC

## 2019-09-18 MED ORDER — WEGOVY 0.5 MG/0.5ML ~~LOC~~ SOAJ: 0.5000 mg | SUBCUTANEOUS | 0 refills | Status: DC

## 2019-09-18 NOTE — Progress Notes (Signed)
Pt has seen results on MyChart and she has a visit with PCP today.

## 2019-09-18 NOTE — Assessment & Plan Note (Signed)
Has done well in the past with viscosupplementation, we started Orthovisc today in both knees, return in 1 week for Orthovisc No. 2 of 4 both knees

## 2019-09-18 NOTE — Assessment & Plan Note (Signed)
Fatigue is multifactorial in this patient, a very appropriate lab work-up was negative, there is certainly a psychiatric component, stressors at home, we are going to increase her Zoloft to 100 mg for the next several months, I am also going to increase her levothyroxine from 25 mcg to 50 mcg, her TSH was above 3, ideally we want this below 2.5. We can follow this up at a 6 week follow-up visit. Recheck TSH in 6 weeks.

## 2019-09-18 NOTE — Assessment & Plan Note (Signed)
Due for Cologuard testing

## 2019-09-18 NOTE — Assessment & Plan Note (Signed)
We have also discussed weight loss, we have also tried to get her approved for bariatric surgery which is a plan exclusion. I am going to start [facility] today. I like to see her back in 3 months to evaluate tolerance. I will call in the first 3 months of this medication.

## 2019-09-18 NOTE — Progress Notes (Signed)
    Procedures performed today:    Procedure: Real-time Ultrasound Guided injection of the left knee Device: Samsung HS60  Verbal informed consent obtained.  Time-out conducted.  Noted no overlying erythema, induration, or other signs of local infection.  Skin prepped in a sterile fashion.  Local anesthesia: Topical Ethyl chloride.  With sterile technique and under real time ultrasound guidance:  30 mg/2 mL of OrthoVisc (sodium hyaluronate) in a prefilled syringe was injected easily into the knee through a 22-gauge needle.   Completed without difficulty  Advised to call if fevers/chills, erythema, induration, drainage, or persistent bleeding.  Images permanently stored and available for review in the ultrasound unit.  Impression: Technically successful ultrasound guided injection.  Procedure: Real-time Ultrasound Guided injection of the right knee Device: Samsung HS60  Verbal informed consent obtained.  Time-out conducted.  Noted no overlying erythema, induration, or other signs of local infection.  Skin prepped in a sterile fashion.  Local anesthesia: Topical Ethyl chloride.  With sterile technique and under real time ultrasound guidance:  30 mg/2 mL of OrthoVisc (sodium hyaluronate) in a prefilled syringe was injected easily into the knee through a 22-gauge needle.   Completed without difficulty  Advised to call if fevers/chills, erythema, induration, drainage, or persistent bleeding.  Images permanently stored and available for review in the ultrasound unit.  Impression: Technically successful ultrasound guided injection.  Independent interpretation of notes and tests performed by another provider:   None.  Brief History, Exam, Impression, and Recommendations:    Primary osteoarthritis of both knees Has done well in the past with viscosupplementation, we started Orthovisc today in both knees, return in 1 week for Orthovisc No. 2 of 4 both knees  Fatigue Fatigue is  multifactorial in this patient, a very appropriate lab work-up was negative, there is certainly a psychiatric component, stressors at home, we are going to increase her Zoloft to 100 mg for the next several months, I am also going to increase her levothyroxine from 25 mcg to 50 mcg, her TSH was above 3, ideally we want this below 2.5. We can follow this up at a 6 week follow-up visit. Recheck TSH in 6 weeks.  Morbid obesity with BMI of 45.0-49.9, adult ([place]) We have also discussed weight loss, we have also tried to get her approved for bariatric surgery which is a plan exclusion. I am going to start [facility] today. I like to see her back in 3 months to evaluate tolerance. I will call in the first 3 months of this medication.  Annual physical exam Due for Cologuard testing    ___________________________________________ [name]. [name], M.D., ABFM., CAQSM. Primary Care and [place] Instructor of [place] [employer] [facility] of Medicine

## 2019-09-25 ENCOUNTER — Other Ambulatory Visit: Payer: Self-pay

## 2019-09-25 ENCOUNTER — Ambulatory Visit (INDEPENDENT_AMBULATORY_CARE_PROVIDER_SITE_OTHER): Payer: PRIVATE HEALTH INSURANCE | Admitting: Sports Medicine

## 2019-09-25 DIAGNOSIS — M170 Bilateral primary osteoarthritis of knee: Secondary | ICD-10-CM | POA: Diagnosis not present

## 2019-09-25 DIAGNOSIS — H8113 Benign paroxysmal vertigo, bilateral: Secondary | ICD-10-CM | POA: Diagnosis not present

## 2019-09-25 DIAGNOSIS — H8110 Benign paroxysmal vertigo, unspecified ear: Secondary | ICD-10-CM | POA: Insufficient documentation

## 2019-09-25 DIAGNOSIS — E039 Hypothyroidism, unspecified: Secondary | ICD-10-CM

## 2019-09-25 NOTE — Addendum Note (Signed)
Addended by: Silverio Decamp on: 09/25/2019 11:31 AM   Modules accepted: Orders, Level of Service

## 2019-09-25 NOTE — Patient Instructions (Signed)
Benign Positional Vertigo Vertigo is the feeling that you or your surroundings are moving when they are not. Benign positional vertigo is the most common form of vertigo. This is usually a harmless condition (benign). This condition is positional. This means that symptoms are triggered by certain movements and positions. This condition can be dangerous if it occurs while you are doing something that could cause harm to you or others. This includes activities such as driving or operating machinery. What are the causes? In many cases, the cause of this condition is not known. It may be caused by a disturbance in an area of the inner ear that helps your brain to sense movement and balance. This disturbance can be caused by:  Viral infection (labyrinthitis).  Head injury.  Repetitive motion, such as jumping, dancing, or running. What increases the risk? You are more likely to develop this condition if:  You are a woman.  You are 50 years of age or older. What are the signs or symptoms? Symptoms of this condition usually happen when you move your head or your eyes in different directions. Symptoms may start suddenly, and usually last for less than a minute. They include:  Loss of balance and falling.  Feeling like you are spinning or moving.  Feeling like your surroundings are spinning or moving.  Nausea and vomiting.  Blurred vision.  Dizziness.  Involuntary eye movement (nystagmus). Symptoms can be mild and cause only minor problems, or they can be severe and interfere with daily life. Episodes of benign positional vertigo may return (recur) over time. Symptoms may improve over time. How is this diagnosed? This condition may be diagnosed based on:  Your medical history.  Physical exam of the head, neck, and ears.  Tests, such as: ? MRI. ? CT scan. ? Eye movement tests. Your health care provider may ask you to change positions quickly while he or she watches you for symptoms  of benign positional vertigo, such as nystagmus. Eye movement may be tested with a variety of exams that are designed to evaluate or stimulate vertigo. ? An electroencephalogram (EEG). This records electrical activity in your brain. ? Hearing tests. You may be referred to a health care provider who specializes in ear, nose, and throat (ENT) problems (otolaryngologist) or a provider who specializes in disorders of the nervous system (neurologist). How is this treated?  This condition may be treated in a session in which your health care provider moves your head in specific positions to adjust your inner ear back to normal. Treatment for this condition may take several sessions. Surgery may be needed in severe cases, but this is rare. In some cases, benign positional vertigo may resolve on its own in 2-4 weeks. Follow these instructions at home: Safety  Move slowly. Avoid sudden body or head movements or certain positions, as told by your health care provider.  Avoid driving until your health care provider says it is safe for you to do so.  Avoid operating heavy machinery until your health care provider says it is safe for you to do so.  Avoid doing any tasks that would be dangerous to you or others if vertigo occurs.  If you have trouble walking or keeping your balance, try using a cane for stability. If you feel dizzy or unstable, sit down right away.  Return to your normal activities as told by your health care provider. Ask your health care provider what activities are safe for you. General instructions  Take over-the-counter   and prescription medicines only as told by your health care provider.  Drink enough fluid to keep your urine pale yellow.  Keep all follow-up visits as told by your health care provider. This is important. Contact a health care provider if:  You have a fever.  Your condition gets worse or you develop new symptoms.  Your family or friends notice any  behavioral changes.  You have nausea or vomiting that gets worse.  You have numbness or a "pins and needles" sensation. Get help right away if you:  Have difficulty speaking or moving.  Are always dizzy.  Faint.  Develop severe headaches.  Have weakness in your legs or arms.  Have changes in your hearing or vision.  Develop a stiff neck.  Develop sensitivity to light. Summary  Vertigo is the feeling that you or your surroundings are moving when they are not. Benign positional vertigo is the most common form of vertigo.  The cause of this condition is not known. It may be caused by a disturbance in an area of the inner ear that helps your brain to sense movement and balance.  Symptoms include loss of balance and falling, feeling that you or your surroundings are moving, nausea and vomiting, and blurred vision.  This condition can be diagnosed based on symptoms, physical exam, and other tests, such as MRI, CT scan, eye movement tests, and hearing tests.  Follow safety instructions as told by your health care provider. You will also be told when to contact your health care provider in case of problems. This information is not intended to replace advice given to you by your health care provider. Make sure you discuss any questions you have with your health care provider. Document Revised: 07/21/2017 Document Reviewed: 07/21/2017 Elsevier Patient Education  2020 Elsevier Inc.    Meniere Disease  Meniere disease is an inner ear disorder. It causes attacks of a spinning sensation (vertigo), dizziness, and ringing in the ear (tinnitus). It also causes hearing loss and a feeling of fullness or pressure in the ear. This is a lifelong condition, and it may get worse over time. You may have drop attacks or severe dizziness that makes you fall. A drop attack is when you suddenly fall without losing consciousness and you quickly recover after a few seconds or minutes. What are the  causes? This condition is caused by having too much of the fluid that is in your inner ear (endolymph). When fluid builds up in your inner ear, it affects the nerves that control balance and hearing. The reason for the fluid buildup is not known. Possible causes include:  Allergies.  An abnormal reaction of the body's defense system (autoimmune disease).  Viral infection of the inner ear.  Head injury. What increases the risk? You are more likely to develop this condition if:  You are older than age 40.  You have a family history of Meniere disease.  You have a history of autoimmune disease.  You have a history of migraine headaches. What are the signs or symptoms? Symptoms of this condition can come and go and may last for up to 4 hours at a time. Symptoms usually start in one ear. They may become more frequent and eventually involve both ears. Symptoms can include:  Fullness and pressure in your ear.  Roaring or ringing in your ear.  Vertigo and loss of balance.  Dizziness.  Decreased hearing.  Nausea and vomiting. How is this diagnosed? This condition is diagnosed based on:    A physical exam.  Tests , such as: ? A hearing test (audiogram). ? An electronystagmogram. This tests your balance nerve (vestibular nerve). ? Imaging studies of your inner ear, such as CT scan or MRI. ? Other balance tests, such as rotational or balance platform tests. How is this treated? There is no cure for this condition, but treatment can help to manage your symptoms. Treatment may include:  A low-salt diet. Limiting salt may help to reduce fluid in the body and relieve symptoms.  Oral or injected medicines to reduce or control: ? Vertigo. ? Nausea. ? Fluid retention. ? Dizziness.  Use of an air pressure pulse generator. This is a machine that sends small pressure pulses into your ear canal.  Hearing aids.  Inner ear surgery. This is rare. When you have symptoms, it can be  helpful to lie down on a flat surface and focus your eyes on one object that does not move. Try to stay in that position until your symptoms go away. Follow these instructions at home: Eating and drinking  Eat the same amount of food at the same time every day, including snacks.  Do not skip meals.  Avoid caffeine.  Drink enough fluids to keep your urine clear or pale yellow.  Limit alcoholic drinks to one drink a day for non-pregnant women and 2 drinks a day for men. One drink equals 12 oz of beer, 5 oz of wine, or 1 oz of hard liquor.  Limit the salt (sodium) in your diet as told by your health care provider. Check ingredients and nutrition facts on packaged foods and beverages.  Do not eat foods that contain monosodium glutamate (MSG). General instructions  Do not use any products that contain nicotine or tobacco, such as cigarettes and e-cigarettes. If you need help quitting, ask your health care provider.  Take over-the-counter and prescription medicines only as told by your health care provider.  Find ways to reduce or avoid stress. If you need help with this, ask your health care provider.  Do not drive if you have vertigo or dizziness. Contact a health care provider if:  You have symptoms that last longer than 4 hours.  You have new or worse symptoms. Get help right away if:  You have been vomiting for 24 hours.  You cannot keep fluids down.  You have chest pain or trouble breathing. Summary  Meniere disease is an inner ear disorder. It causes attacks of a spinning sensation (vertigo), dizziness, and ringing in the ear (tinnitus). It also causes hearing loss and a feeling of fullness or pressure in the ear.  Symptoms of this condition can come and go and may last for up to 4 hours at a time.  When you have symptoms, it can be helpful to lie down on a flat surface and focus your eyes on one object that does not move. Try to stay in that position until your symptoms  go away. This information is not intended to replace advice given to you by your health care provider. Make sure you discuss any questions you have with your health care provider. Document Revised: 01/22/2017 Document Reviewed: 01/01/2016 Elsevier Patient Education  2020 Elsevier Inc.   

## 2019-09-25 NOTE — Progress Notes (Addendum)
    Procedures performed today:    Procedure: Real-time Ultrasound Guidedinjection of the left knee Device: Samsung HS60  Verbal informed consent obtained.  Time-out conducted.  Noted no overlying erythema, induration, or other signs of local infection.  Skin prepped in a sterile fashion.  Local anesthesia: Topical Ethyl chloride.  With sterile technique and under real time ultrasound guidance: 30 mg/2 mL of OrthoVisc (sodium hyaluronate) in a prefilled syringe was injected easily into the knee through a 22-gauge needle.   Completed without difficulty  Advised to call if fevers/chills, erythema, induration, drainage, or persistent bleeding.  Images permanently stored and available for review in the ultrasound unit.  Impression: Technically successful ultrasound guided injection.  Procedure: Real-time Ultrasound Guidedinjection of the right knee Device: Samsung HS60  Verbal informed consent obtained.  Time-out conducted.  Noted no overlying erythema, induration, or other signs of local infection.  Skin prepped in a sterile fashion.  Local anesthesia: Topical Ethyl chloride.  With sterile technique and under real time ultrasound guidance: 30 mg/2 mL of OrthoVisc (sodium hyaluronate) in a prefilled syringe was injected easily into the knee through a 22-gauge needle.   Completed without difficulty  Advised to call if fevers/chills, erythema, induration, drainage, or persistent bleeding.  Images permanently stored and available for review in the ultrasound unit.  Impression: Technically successful ultrasound guided injection.  Independent interpretation of notes and tests performed by another provider:   None.  Brief History, Exam, Impression, and Recommendations:    Primary osteoarthritis of both knees Orthovisc No. 2 of 4 into both knees, return in 1 week for Orthovisc No. 3 of 4 both knees.  Benign positional vertigo This is a pleasant 54 year old female, she has a history  of vertiginous symptoms with changes in head position, there is also some low-grade chronic tinnitus. I do think symptoms are most consistent with benign paroxysmal positional vertigo, we discussed the mechanism, as well as the treatment, aggressive vestibular physical therapy, we will do this for 4 to 6 weeks, if no improvement we will consider Mnire's disease as a possible cause.  Hypothyroidism (acquired) TSH was above 2.5, we increase levothyroxine from 25 mcg to 50 mcg with a good improvement in symptoms. No change in plan. We do need to check her TSH in about 5 weeks.    ___________________________________________ [name]. [name], M.D., ABFM., CAQSM. Primary Care and [place] Instructor of [place] of [facility] of Medicine

## 2019-09-25 NOTE — Assessment & Plan Note (Signed)
TSH was above 2.5, we increase levothyroxine from 25 mcg to 50 mcg with a good improvement in symptoms. No change in plan. We do need to check her TSH in about 5 weeks.

## 2019-09-25 NOTE — Assessment & Plan Note (Signed)
Orthovisc No. 2 of 4 into both knees, return in 1 week for Orthovisc No. 3 of 4 both knees.

## 2019-09-25 NOTE — Assessment & Plan Note (Signed)
This is a pleasant 54 year old female, she has a history of vertiginous symptoms with changes in head position, there is also some low-grade chronic tinnitus. I do think symptoms are most consistent with benign paroxysmal positional vertigo, we discussed the mechanism, as well as the treatment, aggressive vestibular physical therapy, we will do this for 4 to 6 weeks, if no improvement we will consider Mnire's disease as a possible cause.

## 2019-10-02 ENCOUNTER — Ambulatory Visit (INDEPENDENT_AMBULATORY_CARE_PROVIDER_SITE_OTHER): Payer: PRIVATE HEALTH INSURANCE | Admitting: Sports Medicine

## 2019-10-02 DIAGNOSIS — M170 Bilateral primary osteoarthritis of knee: Secondary | ICD-10-CM

## 2019-10-02 NOTE — Assessment & Plan Note (Addendum)
Orthovisc No. 3 of 4 into both knees through a spinal needle, return in 1 week for #4 of 4.

## 2019-10-02 NOTE — Progress Notes (Signed)
    Procedures performed today:    Procedure: Real-time Ultrasound Guidedinjection of theleft knee Device: Samsung HS60  Verbal informed consent obtained.  Time-out conducted.  Noted no overlying erythema, induration, or other signs of local infection.  Skin prepped in a sterile fashion.  Local anesthesia: Topical Ethyl chloride.  With sterile technique and under real time ultrasound guidance:30 mg/2 mL of OrthoVisc (sodium hyaluronate) in a prefilled syringe was injected easily into the knee through a 22-gauge spinal needle.  Completed without difficulty  Advised to call if fevers/chills, erythema, induration, drainage, or persistent bleeding.  Images permanently stored and available for review in the ultrasound unit.  Impression: Technically successful ultrasound guided injection.  Procedure: Real-time Ultrasound Guidedinjection of theright knee Device: Samsung HS60  Verbal informed consent obtained.  Time-out conducted.  Noted no overlying erythema, induration, or other signs of local infection.  Skin prepped in a sterile fashion.  Local anesthesia: Topical Ethyl chloride.  With sterile technique and under real time ultrasound guidance:30 mg/2 mL of OrthoVisc (sodium hyaluronate) in a prefilled syringe was injected easily into the knee through a 22-gauge spinal needle.  Completed without difficulty  Advised to call if fevers/chills, erythema, induration, drainage, or persistent bleeding.  Images permanently stored and available for review in the ultrasound unit.  Impression: Technically successful ultrasound guided injection.  Independent interpretation of notes and tests performed by another provider:   None.  Brief History, Exam, Impression, and Recommendations:    Primary osteoarthritis of both knees Orthovisc No. 3 of 4 into both knees through a spinal needle, return in 1 week for #4 of 4.    ___________________________________________ [name].  [name], M.D., ABFM., CAQSM. Primary Care and [place] Instructor of [place] of [facility] of Medicine

## 2019-10-10 ENCOUNTER — Other Ambulatory Visit: Payer: Self-pay

## 2019-10-10 ENCOUNTER — Ambulatory Visit (INDEPENDENT_AMBULATORY_CARE_PROVIDER_SITE_OTHER): Payer: PRIVATE HEALTH INSURANCE | Admitting: Sports Medicine

## 2019-10-10 DIAGNOSIS — M170 Bilateral primary osteoarthritis of knee: Secondary | ICD-10-CM | POA: Diagnosis not present

## 2019-10-10 DIAGNOSIS — M545 Low back pain, unspecified: Secondary | ICD-10-CM | POA: Insufficient documentation

## 2019-10-10 DIAGNOSIS — G8929 Other chronic pain: Secondary | ICD-10-CM | POA: Diagnosis not present

## 2019-10-10 NOTE — Assessment & Plan Note (Signed)
1 month of left-sided low back pain proximal to the iliac crest consistent with a quadratus lumborum strain, she did have an event that could cause this trauma. Adding home rehab exercises, return to see me in a month, potential advanced imaging of the lumbar spine if no better.

## 2019-10-10 NOTE — Progress Notes (Signed)
    Procedures performed today:    Procedure: Real-time Ultrasound Guidedinjection of theleft knee Device: Samsung HS60  Verbal informed consent obtained.  Time-out conducted.  Noted no overlying erythema, induration, or other signs of local infection.  Skin prepped in a sterile fashion.  Local anesthesia: Topical Ethyl chloride.  With sterile technique and under real time ultrasound guidance:30 mg/2 mL of OrthoVisc (sodium hyaluronate) in a prefilled syringe was injected easily into the knee through a 22-gauge spinal needle.  Completed without difficulty  Advised to call if fevers/chills, erythema, induration, drainage, or persistent bleeding.  Images permanently stored and available for review in the ultrasound unit.  Impression: Technically successful ultrasound guided injection.  Procedure: Real-time Ultrasound Guidedinjection of theright knee Device: Samsung HS60  Verbal informed consent obtained.  Time-out conducted.  Noted no overlying erythema, induration, or other signs of local infection.  Skin prepped in a sterile fashion.  Local anesthesia: Topical Ethyl chloride.  With sterile technique and under real time ultrasound guidance:30 mg/2 mL of OrthoVisc (sodium hyaluronate) in a prefilled syringe was injected easily into the knee through a 22-gauge spinal needle.  Completed without difficulty  Advised to call if fevers/chills, erythema, induration, drainage, or persistent bleeding.  Images permanently stored and available for review in the ultrasound unit.  Impression: Technically successful ultrasound guided injection.  Independent interpretation of notes and tests performed by another provider:   None.  Brief History, Exam, Impression, and Recommendations:    Primary osteoarthritis of both knees Orthovisc 4 of 4 both knees.   Already doing much better.  Chronic left-sided low back pain 1 month of left-sided low back pain proximal to the iliac crest  consistent with a quadratus lumborum strain, she did have an event that could cause this trauma. Adding home rehab exercises, return to see me in a month, potential advanced imaging of the lumbar spine if no better.    ___________________________________________ [name]. [name], M.D., ABFM., CAQSM. Primary Care and [place] Instructor of [place] of [facility] of Medicine

## 2019-10-10 NOTE — Assessment & Plan Note (Addendum)
Orthovisc 4 of 4 both knees.   Already doing much better.

## 2019-10-16 ENCOUNTER — Telehealth: Payer: Self-pay | Admitting: Sports Medicine

## 2019-10-16 NOTE — Telephone Encounter (Signed)
No, needs to be in person

## 2019-10-16 NOTE — Telephone Encounter (Signed)
Patient wanted to know if her appointment can be virtual instead of in person?

## 2019-10-16 NOTE — Telephone Encounter (Signed)
Let patient know that it needs to be in person.

## 2019-10-17 ENCOUNTER — Ambulatory Visit: Payer: PRIVATE HEALTH INSURANCE | Admitting: Family Medicine

## 2019-10-17 ENCOUNTER — Encounter: Payer: Self-pay | Admitting: Family Medicine

## 2019-10-17 ENCOUNTER — Ambulatory Visit (INDEPENDENT_AMBULATORY_CARE_PROVIDER_SITE_OTHER): Payer: PRIVATE HEALTH INSURANCE | Admitting: Family Medicine

## 2019-10-17 ENCOUNTER — Other Ambulatory Visit: Payer: Self-pay

## 2019-10-17 DIAGNOSIS — R600 Localized edema: Secondary | ICD-10-CM

## 2019-10-17 MED ORDER — CEPHALEXIN 500 MG PO CAPS
500.0000 mg | ORAL_CAPSULE | Freq: Four times a day (QID) | ORAL | 0 refills | Status: AC
Start: 2019-10-17 — End: 2019-10-27

## 2019-10-17 NOTE — Progress Notes (Signed)
Beverly Jackson - 54 y.o. female MRN [id number]  Date of birth: Jul 05, 1965  Subjective Chief Complaint  Patient presents with  . Leg Pain    HPI Beverly Jackson is a 54 y.o. female here today with complaint of L leg swelling, redness and warmth.  Symptoms started at few days ago.  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 She has increased swelling in her legs if standing for long periods.  She has had cellulitis in her feet previously and this feels similar.  She denies fever or chills.  She has not worked in the past couple of days and been off her feet more.  Symptoms have nearly resolved today.    ROS:  A comprehensive ROS was completed and negative except as noted per HPI  Allergies  Allergen Reactions  . Mixed Ragweed Itching       . Penicillins Hives, Itching and Rash       . Short Ragweed Pollen Ext Itching    Past Medical History:  Diagnosis Date  . Anxiety   . Cancer ([place])   . Chest pain   . 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  . Edema, lower extremity   . Gallbladder disease   . Hypertension   . Joint pain   . Low blood sugar   . MVP (mitral valve prolapse)   . Obesity   . Osteoarthritis   . Sleep apnea   . Thyroid disease   . Vitamin D deficiency     Past Surgical History:  Procedure Laterality Date  . ABDOMINAL HYSTERECTOMY    . BREAST SURGERY    . CESAREAN SECTION    . CHOLECYSTECTOMY      Social History   Socioeconomic History  . Marital status: Unknown    Spouse name: Beverly Jackson  . Number of children: Not on file  . Years of education: Not on file  . Highest education level: Not on file  Occupational History  . 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 Smoking status: Never Smoker  . Smokeless tobacco: Never Used  Substance and Sexual Activity  . Alcohol use: Never  . Drug use: Never  . Sexual activity: Never  Other Topics Concern  . 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 Difficulty of Paying Living Expenses: Not on file  Food Insecurity:   . Worried About Charity fundraiser in the Last Year: Not on file  . Ran Out of Food in the Last Year: Not on file  Transportation Needs:   . Lack of Transportation (Medical): Not on file  . Lack of Transportation (Non-Medical): Not on file  Physical Activity:   . Days of Exercise per Week: Not on file  . Minutes of Exercise per Session: Not on file  Stress:   . Feeling of Stress : Not on file  Social Connections:   . Frequency of Communication with Friends and Family: Not on file  . Frequency of Social Gatherings with Friends and Family: Not on file  . Attends Religious Services: Not on file  . Active Member of Clubs or Organizations: Not on file  . Attends Archivist Meetings: Not on file  . Marital Status: Not on file    Family History  Problem Relation Age of  Onset  . Hypertension Mother   . Depression Mother   . Atrial fibrillation Mother   . Thyroid disease Mother   . Hyperlipidemia Mother   . Stroke Mother   . Cancer Mother   . Obesity Mother   . Cancer Father     Health Maintenance  Topic Date Due  . INFLUENZA VACCINE  09/24/2019  . COLONOSCOPY  09/13/2020 (Originally 10/28/2017)  . TETANUS/TDAP  09/13/2020 (Originally 10/29/1986)  . Hepatitis C Screening  09/13/2020 (Originally 23-Mar-1965)  . MAMMOGRAM  08/02/2020  . COVID-19 Vaccine  Completed  . HIV Screening  Discontinued     ----------------------------------------------------------------------------------------------------------------------------------------------------------------------------------------------------------------- Physical Exam BP 138/70 (BP Location: Left Arm, Patient Position: Sitting, Cuff Size: Large)   Pulse 75   Temp 97.9 F (36.6 C) (Oral)   Ht 5' 1.81" (1.57 m)   Wt  259 lb 11.2 oz (117.8 kg)   SpO2 99%   BMI 47.79 kg/m   Physical Exam Constitutional:      Appearance: Normal appearance.  Eyes:     General: No scleral icterus. Cardiovascular:     Rate and Rhythm: Normal rate and regular rhythm.  Pulmonary:     Effort: Pulmonary effort is normal.     Breath sounds: Normal breath sounds.  Skin:    Comments: No swelling or erythema of leg noted today.  DP pulse 2+  Neurological:     General: No focal deficit present.     Mental Status: She is alert.  Psychiatric:        Mood and Affect: Mood normal.        Behavior: Behavior normal.     ------------------------------------------------------------------------------------------------------------------------------------------------------------------------------------------------------------------- Assessment and Plan  Edema of left lower extremity Chronic dependent edema of LLE Discussed trying compression stockings but she does not like these.  Recommend keeping legs elevated at rest.   Does not appear to have cellulitis at this time but her history is concerning for this especially given previous episodes affecting the legs and feet.  Will send in rx for cephalexin and instructed to start if symptoms return.     Meds ordered this encounter  Medications  . cephALEXin (KEFLEX) 500 MG capsule    Sig: Take 1 capsule (500 mg total) by mouth 4 (four) times daily for 10 days.    Dispense:  40 capsule    Refill:  0    No follow-ups on file.    This visit occurred during the SARS-CoV-2 public health emergency.  Safety protocols were in place, including screening questions prior to the visit, additional usage of staff PPE, and extensive cleaning of exam room while observing appropriate contact time as indicated for disinfecting solutions.

## 2019-10-17 NOTE — Assessment & Plan Note (Signed)
Chronic dependent edema of LLE Discussed trying compression stockings but she does not like these.  Recommend keeping legs elevated at rest.   Does not appear to have cellulitis at this time but her history is concerning for this especially given previous episodes affecting the legs and feet.  Will send in rx for cephalexin and instructed to start if symptoms return.

## 2019-10-18 ENCOUNTER — Telehealth: Payer: Self-pay

## 2019-10-18 NOTE — Telephone Encounter (Signed)
I asked patient specifically if she had taken keflex before, and she said she had and tolerated ok.  If she has taken this previously it should be ok to take.

## 2019-10-18 NOTE — Telephone Encounter (Signed)
Pt called with concerns about the Keflex antibiotic. Per pt, she did not pick up the rx because pharmacist informed her that it contains penicillin, which she is allergic to. Should pt take the rx? Pls advise, thanks.

## 2019-10-18 NOTE — Telephone Encounter (Signed)
Patient advised. Shew also confirmed on phone with me that she has taken this before and was fine. She will get RX and if theres an issue call us back

## 2019-10-31 ENCOUNTER — Ambulatory Visit: Payer: PRIVATE HEALTH INSURANCE | Admitting: Sports Medicine

## 2019-11-19 ENCOUNTER — Other Ambulatory Visit: Payer: Self-pay | Admitting: Sports Medicine

## 2019-12-04 ENCOUNTER — Telehealth: Payer: Self-pay

## 2019-12-04 ENCOUNTER — Other Ambulatory Visit: Payer: Self-pay | Admitting: Family Medicine

## 2019-12-04 MED ORDER — FLUCONAZOLE 150 MG PO TABS: 150.0000 mg | ORAL_TABLET | Freq: Once | ORAL | 0 refills | Status: AC

## 2019-12-04 NOTE — Telephone Encounter (Signed)
Patient has been advised Rx sent to pharmacy.

## 2019-12-04 NOTE — Telephone Encounter (Signed)
Rx refill entered.

## 2019-12-04 NOTE — Telephone Encounter (Signed)
Pt lvm stating cellulitis ABX caused yeast infection.   Requesting medication be sent to [facility] @ [address] for yeast infection.

## 2019-12-08 ENCOUNTER — Other Ambulatory Visit: Payer: Self-pay | Admitting: Sports Medicine

## 2020-02-13 ENCOUNTER — Other Ambulatory Visit: Payer: Self-pay | Admitting: Sports Medicine

## 2020-03-07 ENCOUNTER — Other Ambulatory Visit: Payer: Self-pay | Admitting: Sports Medicine

## 2020-03-20 ENCOUNTER — Ambulatory Visit (INDEPENDENT_AMBULATORY_CARE_PROVIDER_SITE_OTHER): Payer: PRIVATE HEALTH INSURANCE | Admitting: Sports Medicine

## 2020-03-20 ENCOUNTER — Other Ambulatory Visit: Payer: Self-pay

## 2020-03-20 VITALS — BP 127/80 | HR 79 | Ht 61.0 in | Wt 260.0 lb

## 2020-03-20 DIAGNOSIS — Z0000 Encounter for general adult medical examination without abnormal findings: Secondary | ICD-10-CM | POA: Diagnosis not present

## 2020-03-20 DIAGNOSIS — G4486 Cervicogenic headache: Secondary | ICD-10-CM | POA: Diagnosis not present

## 2020-03-20 DIAGNOSIS — Z1211 Encounter for screening for malignant neoplasm of colon: Secondary | ICD-10-CM | POA: Diagnosis not present

## 2020-03-20 DIAGNOSIS — R519 Headache, unspecified: Secondary | ICD-10-CM | POA: Insufficient documentation

## 2020-03-20 MED ORDER — PREDNISONE 50 MG PO TABS: ORAL_TABLET | ORAL | 0 refills | Status: DC

## 2020-03-20 MED ORDER — TOPIRAMATE 50 MG PO TABS: ORAL_TABLET | ORAL | 3 refills | Status: DC

## 2020-03-20 NOTE — Assessment & Plan Note (Signed)
Up-to-date on breast cancer screening, still needs to do Cologuard.

## 2020-03-20 NOTE — Assessment & Plan Note (Addendum)
This is a pleasant 55 year old female, for the past month she has had daily, intermittent headaches in the left side, occasional neck pain, no aura, photophobia, phonophobia, nausea, no trauma. No constitutional symptoms, neurologic exam is normal. This is likely cervical cephalgia. We will treat this with 5 days of prednisone, cervical spine x-rays were done at [facility] and were normal, home rehab exercises. She does have a history of some migraines as well so we will also add Topamax for prevention, this will also help her lose some weight. Return to see me in about 6 weeks. Cervical spine MRI if no better.

## 2020-03-20 NOTE — Progress Notes (Addendum)
    Procedures performed today:    None.  Independent interpretation of notes and tests performed by another provider:   None.  Brief History, Exam, Impression, and Recommendations:    Annual physical exam Up-to-date on breast cancer screening, still needs to do Cologuard.  Cervical cephalgia This is a pleasant 55 year old female, for the past month she has had daily, intermittent headaches in the left side, occasional neck pain, no aura, photophobia, phonophobia, nausea, no trauma. No constitutional symptoms, neurologic exam is normal. This is likely cervical cephalgia. We will treat this with 5 days of prednisone, cervical spine x-rays were done at [facility] and were normal, home rehab exercises. She does have a history of some migraines as well so we will also add Topamax for prevention, this will also help her lose some weight. Return to see me in about 6 weeks. Cervical spine MRI if no better.    ___________________________________________ [name]. [name], M.D., ABFM., CAQSM. Primary Care and [place] Instructor of [place] of [facility] of Medicine

## 2020-03-20 NOTE — Addendum Note (Signed)
Addended by: Silverio Decamp on: 03/20/2020 09:06 AM   Modules accepted: Orders

## 2020-03-29 ENCOUNTER — Telehealth: Payer: Self-pay

## 2020-03-29 NOTE — Telephone Encounter (Signed)
Tish called and left a message stating her headaches have worsened. She states she would like to proceed with a CT. Please advise.

## 2020-03-29 NOTE — Telephone Encounter (Signed)
It is too early, she needs 6 weeks of conservative treatment before we can order the MRI or else they will make her pay in cash because the insurance company will not approve it.

## 2020-04-02 NOTE — Telephone Encounter (Signed)
Spoke with patient, she is aware that she will need to complete 6 weeks of conservative therapy before we can proceed with advanced imaging. Patient states her headaches are worsening. Call transferred to the front desk for patient to schedule a virtual appt to reassess headaches.

## 2020-04-10 ENCOUNTER — Telehealth: Payer: Self-pay

## 2020-04-10 ENCOUNTER — Telehealth (INDEPENDENT_AMBULATORY_CARE_PROVIDER_SITE_OTHER): Payer: PRIVATE HEALTH INSURANCE | Admitting: Sports Medicine

## 2020-04-10 ENCOUNTER — Other Ambulatory Visit: Payer: Self-pay

## 2020-04-10 ENCOUNTER — Ambulatory Visit (INDEPENDENT_AMBULATORY_CARE_PROVIDER_SITE_OTHER): Payer: PRIVATE HEALTH INSURANCE

## 2020-04-10 DIAGNOSIS — G4486 Cervicogenic headache: Secondary | ICD-10-CM

## 2020-04-10 DIAGNOSIS — M47812 Spondylosis without myelopathy or radiculopathy, cervical region: Secondary | ICD-10-CM

## 2020-04-10 MED ORDER — RIZATRIPTAN BENZOATE 5 MG PO TBDP: 5.0000 mg | ORAL_TABLET | ORAL | 0 refills | Status: DC | PRN

## 2020-04-10 MED ORDER — BUTALBITAL-APAP-CAFFEINE 50-325-40 MG PO TABS: 1.0000 | ORAL_TABLET | Freq: Four times a day (QID) | ORAL | 0 refills | Status: DC | PRN

## 2020-04-10 MED ORDER — TOPIRAMATE 50 MG PO TABS: 50.0000 mg | ORAL_TABLET | Freq: Two times a day (BID) | ORAL | 3 refills | Status: DC

## 2020-04-10 NOTE — Telephone Encounter (Signed)
Call from pharmacist at [facility] that [place] causes tamoxifen to be ineffective up to 60-86%. They want a different headache medication sent in.

## 2020-04-10 NOTE — Progress Notes (Signed)
Saw oncologist and had a [place] on thyroid today due to neck pain. She is not sure if this could be contributing to the headaches or not.

## 2020-04-10 NOTE — Assessment & Plan Note (Signed)
This is a pleasant 55 year old female with chronic headaches, daily, intermittent, left-sided, no photophobia, no phonophobia, no aura, nausea, no trauma. Headaches generally last all day. We added Topamax daily which has not been effective, we added prednisone which has not helped. At this point we are going to bump Topamax up to twice daily, add Fioricet, x-rays of her cervical spine. She will follow-up with me in 3 weeks and if insufficient improvement we will MRI her cervical spine.

## 2020-04-10 NOTE — Progress Notes (Signed)
   Virtual Visit via Telephone   I connected with  Beverly Jackson  on 04/10/20 by telephone/telehealth and verified that I am speaking with the correct person using two identifiers.   I discussed the limitations, risks, security and privacy concerns of performing an evaluation and management service by telephone, including the higher likelihood of inaccurate diagnosis and treatment, and the availability of in person appointments.  We also discussed the likely need of an additional face to face encounter for complete and high quality delivery of care.  I also discussed with the patient that there may be a patient responsible charge related to this service. The patient expressed understanding and wishes to proceed.  Provider location is in medical facility. Patient location is at their home, different from provider location. People involved in care of the patient during this telehealth encounter were myself, my nurse/medical assistant, and my front office/scheduling team member.  Review of Systems: No fevers, chills, night sweats, weight loss, chest pain, or shortness of breath.   Objective Findings:    General: Speaking full sentences, no audible heavy breathing.  Sounds alert and appropriately interactive.    Independent interpretation of tests performed by another provider:   None.  Brief History, Exam, Impression, and Recommendations:    Cervical cephalgia This is a pleasant 55 year old female with chronic headaches, daily, intermittent, left-sided, no photophobia, no phonophobia, no aura, nausea, no trauma. Headaches generally last all day. We added Topamax daily which has not been effective, we added prednisone which has not helped. At this point we are going to bump Topamax up to twice daily, add Fioricet, x-rays of her cervical spine. She will follow-up with me in 3 weeks and if insufficient improvement we will MRI her cervical spine.   I discussed the above assessment and treatment  plan with the patient. The patient was provided an opportunity to ask questions and all were answered. The patient agreed with the plan and demonstrated an understanding of the instructions.   The patient was advised to call back or seek an in-person evaluation if the symptoms worsen or if the condition fails to improve as anticipated.   I provided 30 minutes of face to face and non-face-to-face time during this encounter date, time was needed to gather information, review chart, records, communicate/coordinate with staff remotely, as well as complete documentation.   ___________________________________________ [name]. [name], M.D., ABFM., CAQSM. Primary Care and Sports Medicine [facility]  Adjunct Professor of [place] of [facility] [employer] [facility] of Medicine

## 2020-04-10 NOTE — Telephone Encounter (Signed)
This interaction is theoretical and not really seen in clinical practice. In addition with the infrequency that she would be taking Fioricet it will not result in any long-term consequences. I am going to switch to Maxalt and if this is not effective we are going back to Fioricet.

## 2020-04-11 NOTE — Telephone Encounter (Signed)
Patient reports taking a Maxalt last night and it took her headache away in 10 minutes. She also has had only a minimal headache today.

## 2020-04-12 NOTE — Telephone Encounter (Signed)
This is confirmation that her headaches are due to migraines, if she develops more than 4-6 total headache days in a month we will consider changing her preventative agent.

## 2020-04-12 NOTE — Telephone Encounter (Signed)
Patient aware of recommendation.  

## 2020-05-22 ENCOUNTER — Encounter: Payer: Self-pay | Admitting: Medical-Surgical

## 2020-05-22 ENCOUNTER — Other Ambulatory Visit: Payer: Self-pay

## 2020-05-22 ENCOUNTER — Ambulatory Visit (INDEPENDENT_AMBULATORY_CARE_PROVIDER_SITE_OTHER): Payer: PRIVATE HEALTH INSURANCE | Admitting: Medical-Surgical

## 2020-05-22 ENCOUNTER — Ambulatory Visit (INDEPENDENT_AMBULATORY_CARE_PROVIDER_SITE_OTHER): Payer: PRIVATE HEALTH INSURANCE

## 2020-05-22 VITALS — BP 123/80 | HR 88 | Temp 97.5°F | Ht 61.0 in | Wt 257.8 lb

## 2020-05-22 DIAGNOSIS — R238 Other skin changes: Secondary | ICD-10-CM | POA: Diagnosis not present

## 2020-05-22 DIAGNOSIS — M7989 Other specified soft tissue disorders: Secondary | ICD-10-CM

## 2020-05-22 DIAGNOSIS — M79604 Pain in right leg: Secondary | ICD-10-CM

## 2020-05-22 NOTE — Progress Notes (Signed)
Subjective:    CC: poss cellulitis  HPI: Pleasant 55 year old female presenting for evaluation of lower extremity redness and swelling that has been present for the past 2 days. She originally thought her discomfort was related to her chronic leg cramps and she was unable to see the area in question. Yesterday, she was able to see that the area was red and swollen. Has had several episodes in the past with lower extremity cellulitis and is worried that this may be a recurrence. Has a planned trip to [place] next week and is very worried about being able to go. Denies fever, chills, shortness of breath, chest pain, headaches, vision changes, and dizziness. No long periods of sitting or riding in the car for long periods. Currently on tamoxifen.   Has several questions about how to manage her diuretics while on her trip and precautions to take on the prolonged plane trip.   I reviewed the past medical history, family history, social history, surgical history, and allergies today and no changes were needed.  Please see the problem list section below in epic for further details.  Past Medical History: Past Medical History:  Diagnosis Date  . Anxiety   . Cancer ([place])   . Chest pain   . 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  . Edema, lower extremity   . Gallbladder disease   . Hypertension   . Joint pain   . Low blood sugar   . MVP (mitral valve prolapse)   . Obesity   . Osteoarthritis   . Sleep apnea   . Thyroid disease   . Vitamin D deficiency    Past Surgical History: Past Surgical History:  Procedure Laterality Date  . ABDOMINAL HYSTERECTOMY    . BREAST SURGERY    . CESAREAN SECTION    . CHOLECYSTECTOMY     Social History: Social History   Socioeconomic History  . Marital status: Unknown    Spouse name: phillip  . Number of children: Not on file  . Years of education: Not on file  . Highest education level: Not on file  Occupational History  . 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 Smoking status: Never Smoker  . Smokeless tobacco: Never Used  Substance and Sexual Activity  . Alcohol use: Never  . Drug use: Never  . Sexual activity: Never  Other Topics Concern  . 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 Hypertension Mother   . Depression Mother   . Atrial fibrillation Mother   . Thyroid disease Mother   . Hyperlipidemia Mother   . Stroke Mother   . Cancer Mother   . Obesity Mother   . Cancer Father    Allergies: Allergies  Allergen Reactions  . Mixed Ragweed Itching       . Penicillins Hives, Itching and Rash       . Short Ragweed Pollen Ext Itching   Medications: See med rec.  Review of Systems: See HPI for pertinent positives and negatives.   Objective:    General: Well Developed, well nourished,  and in no acute distress.  Neuro: Alert and oriented x3.  HEENT: Normocephalic, atraumatic.  Skin: Warm and dry. Right lower extremity edema with posterior lower calf erythema and tenderness that extends up to the posterior knee.  Cardiac: Regular rate and rhythm, no murmurs rubs or gallops, no lower extremity edema.  Respiratory: Clear to auscultation bilaterally. Not using accessory muscles, speaking in full sentences.  Impression and Recommendations:    1. Acute leg pain, right 2. Redness and swelling of lower leg Although she does have chronic BLE edema, redness and tenderness could also be cellulitis. Wells score 3 indicating high risk for DVT. Stat right lower extremity US negative for DVT but does show superficial venous  thrombosis of the great saphenous vein in the lower calf consistent with the area of tenderness and erythema.  Currently on meloxicam 15 mg daily so advised patient to continue this and obtain a pair of thigh-high compression stockings for daily use.  Okay to use Tylenol for comfort. - US Venous Img Lower Unilateral Right (DVT); Future  Return if symptoms worsen or fail to improve. ___________________________________________ Clearnce Sorrel, DNP, APRN, FNP-BC Primary Care and [place]

## 2020-06-01 ENCOUNTER — Other Ambulatory Visit: Payer: Self-pay | Admitting: Sports Medicine

## 2020-06-08 ENCOUNTER — Other Ambulatory Visit: Payer: Self-pay | Admitting: Sports Medicine

## 2020-07-15 ENCOUNTER — Telehealth: Payer: Self-pay

## 2020-07-15 DIAGNOSIS — U071 COVID-19: Secondary | ICD-10-CM | POA: Insufficient documentation

## 2020-07-15 MED ORDER — PAXLOVID 20 X 150 MG & 10 X 100MG PO TBPK: 3.0000 | ORAL_TABLET | Freq: Two times a day (BID) | ORAL | 0 refills | Status: AC

## 2020-07-15 NOTE — Telephone Encounter (Signed)
Now that we have the oral antiviral it is a better option for people who are able to take oral medications then any kind of infusion.  I am going to call this in now, but she does need a virtual visit within the next day or 2.

## 2020-07-15 NOTE — Telephone Encounter (Signed)
Patient called to report that she is COVID + and is interested in getting the infusion like her mother did. Please advise.

## 2020-07-15 NOTE — Telephone Encounter (Signed)
A lot of that depends on if she has had her booster or not, we can go over that during the virtual visit but she should quarantine at least until then.  The virtual visit does not have to be with me, it can be with me or any of my partners.

## 2020-07-15 NOTE — Telephone Encounter (Signed)
Patient has COVID and needs a virtual with Dr. Darene Lamer in the next couple days to check in. Please call patient to schedule.

## 2020-07-15 NOTE — Telephone Encounter (Signed)
How long should she quarantine? She is a Haematologist.

## 2020-07-16 ENCOUNTER — Telehealth (INDEPENDENT_AMBULATORY_CARE_PROVIDER_SITE_OTHER): Payer: PRIVATE HEALTH INSURANCE | Admitting: Sports Medicine

## 2020-07-16 DIAGNOSIS — U071 COVID-19: Secondary | ICD-10-CM

## 2020-07-16 MED ORDER — BENZONATATE 200 MG PO CAPS: 200.0000 mg | ORAL_CAPSULE | Freq: Three times a day (TID) | ORAL | 0 refills | Status: DC | PRN

## 2020-07-16 NOTE — Assessment & Plan Note (Signed)
Any has typical symptoms, cough, fevers, chills, muscle aches, body aches, she did test positive for COVID-19, we did already send in Paxlovid, she feels okay right now. She is now 5 days into isolation, which means tomorrow she can come out of isolation and wear well fitting mask, but try to keep her distance from other individuals. She can use NyQuil for nocturnal cough, will add some Tessalon Perles for daytime cough, return to see me as needed.

## 2020-07-16 NOTE — Telephone Encounter (Signed)
Virtual has been scheduled with Dr T. Am

## 2020-07-16 NOTE — Progress Notes (Signed)
   Virtual Visit via Telephone   I connected with  Beverly Jackson  on 07/16/20 by telephone/telehealth and verified that I am speaking with the correct person using two identifiers.   I discussed the limitations, risks, security and privacy concerns of performing an evaluation and management service by telephone, including the higher likelihood of inaccurate diagnosis and treatment, and the availability of in person appointments.  We also discussed the likely need of an additional face to face encounter for complete and high quality delivery of care.  I also discussed with the patient that there may be a patient responsible charge related to this service. The patient expressed understanding and wishes to proceed.  Provider location is in medical facility. Patient location is at their home, different from provider location. People involved in care of the patient during this telehealth encounter were myself, my nurse/medical assistant, and my front office/scheduling team member.  Review of Systems: No fevers, chills, night sweats, weight loss, chest pain, or shortness of breath.   Objective Findings:    General: Speaking full sentences, no audible heavy breathing.  Sounds alert and appropriately interactive.    Independent interpretation of tests performed by another provider:   None.  Brief History, Exam, Impression, and Recommendations:    COVID-19 Any has typical symptoms, cough, fevers, chills, muscle aches, body aches, she did test positive for COVID-19, we did already send in Paxlovid, she feels okay right now. She is now 5 days into isolation, which means tomorrow she can come out of isolation and wear well fitting mask, but try to keep her distance from other individuals. She can use NyQuil for nocturnal cough, will add some Tessalon Perles for daytime cough, return to see me as needed.   I discussed the above assessment and treatment plan with the patient. The patient was provided an  opportunity to ask questions and all were answered. The patient agreed with the plan and demonstrated an understanding of the instructions.   The patient was advised to call back or seek an in-person evaluation if the symptoms worsen or if the condition fails to improve as anticipated.   I provided 30 minutes of verbal and non-verbal time during this encounter date, time was needed to gather information, review chart, records, communicate/coordinate with staff remotely, as well as complete documentation.  Specifically we talked about symptomatology of COVID, the difficulty of making the diagnosis, and isolation/quarantine requirements.   ___________________________________________ [name]. [name], M.D., ABFM., CAQSM. Primary Care and Sports Medicine [facility]  Adjunct Professor of [place] of [facility] of Medicine

## 2020-08-07 ENCOUNTER — Ambulatory Visit: Payer: PRIVATE HEALTH INSURANCE | Admitting: Sports Medicine

## 2020-08-10 ENCOUNTER — Other Ambulatory Visit: Payer: Self-pay | Admitting: Sports Medicine

## 2020-08-10 DIAGNOSIS — G4486 Cervicogenic headache: Secondary | ICD-10-CM

## 2020-08-12 ENCOUNTER — Ambulatory Visit (INDEPENDENT_AMBULATORY_CARE_PROVIDER_SITE_OTHER): Payer: PRIVATE HEALTH INSURANCE | Admitting: Sports Medicine

## 2020-08-12 ENCOUNTER — Other Ambulatory Visit: Payer: Self-pay

## 2020-08-12 DIAGNOSIS — M26602 Left temporomandibular joint disorder, unspecified: Secondary | ICD-10-CM | POA: Insufficient documentation

## 2020-08-12 DIAGNOSIS — K8500 Idiopathic acute pancreatitis without necrosis or infection: Secondary | ICD-10-CM

## 2020-08-12 DIAGNOSIS — K859 Acute pancreatitis without necrosis or infection, unspecified: Secondary | ICD-10-CM | POA: Insufficient documentation

## 2020-08-12 NOTE — Assessment & Plan Note (Addendum)
Beverly Jackson is a pleasant 55 year old female, over a week ago she developed pain, substernal, midepigastric with radiation to the back, she was seen in the ED, ACS was ruled out with ECGs and serial cardiac enzymes, she did have an elevated lipase into the 70s. She does not drink alcohol. She does not have a gallbladder. She did recently get over [place] so it is possible this is an autoimmune mediated phenomenon. I would like to recheck her CBC, CMP, amylase, lipase, triglycerides, we can consider an Ig34 antibody in the future but this is not available through [facility], if any of these are elevated we will certainly add a CT abdomen and pelvis with IV contrast.

## 2020-08-12 NOTE — Progress Notes (Signed)
    Procedures performed today:    None.  Independent interpretation of notes and tests performed by another provider:   I did reviewed notes from the emergency department provider, I reviewed her cardiac enzymes, I reviewed her metabolic panel, I reviewed her lipase, I also ordered several independent/specific tests.  Brief History, Exam, Impression, and Recommendations:    Pancreatitis Beverly Jackson is a pleasant 55 year old female, over a week ago she developed pain, substernal, midepigastric with radiation to the back, she was seen in the ED, ACS was ruled out with ECGs and serial cardiac enzymes, she did have an elevated lipase into the 70s. She does not drink alcohol. She does not have a gallbladder. She did recently get over [place] so it is possible this is an autoimmune mediated phenomenon. I would like to recheck her CBC, CMP, amylase, lipase, we can consider an Ig34 antibody in the future but this is not available through [facility], if any of these are elevated we will certainly add a CT abdomen and pelvis with IV contrast.  Left temporomandibular joint disorder, unspecified Any has also been having pain in her left temple anterior to the ear, with a normal ear exam. Worse with opening and closing her mouth, she also gets it at night and likely grinds her teeth. There is occasional popping from her right TMJ but not the left. She needs to talk to her dentist about a mouthguard followed by TMJ injection if insufficiently better. We we will get an ESR to rule out temporal arteritis.    ___________________________________________ [name]. [name], M.D., ABFM., CAQSM. Primary Care and [place] Instructor of [place] [employer] [facility] [employer]

## 2020-08-12 NOTE — Assessment & Plan Note (Signed)
Beverly Jackson has also been having pain in her left temple anterior to the ear, with a normal ear exam. Worse with opening and closing her mouth, she also gets it at night and likely grinds her teeth. There is occasional popping from her right TMJ but not the left. She needs to talk to her dentist about a mouthguard followed by TMJ injection if insufficiently better. We we will get an ESR to rule out temporal arteritis.

## 2020-08-13 LAB — COMPREHENSIVE METABOLIC PANEL
AG Ratio: 1.3 (calc) (ref 1.0–2.5)
ALT: 24 U/L (ref 6–29)
AST: 21 U/L (ref 10–35)
Albumin: 4 g/dL (ref 3.6–5.1)
Alkaline phosphatase (APISO): 105 U/L (ref 37–153)
BUN: 20 mg/dL (ref 7–25)
CO2: 26 mmol/L (ref 20–32)
Calcium: 9.6 mg/dL (ref 8.6–10.4)
Chloride: 108 mmol/L (ref 98–110)
Creat: 0.98 mg/dL (ref 0.50–1.05)
Globulin: 3.1 g/dL (calc) (ref 1.9–3.7)
Glucose, Bld: 103 mg/dL — ABNORMAL HIGH (ref 65–99)
Potassium: 4 mmol/L (ref 3.5–5.3)
Sodium: 142 mmol/L (ref 135–146)
Total Bilirubin: 0.2 mg/dL (ref 0.2–1.2)
Total Protein: 7.1 g/dL (ref 6.1–8.1)

## 2020-08-13 LAB — CBC WITH DIFFERENTIAL/PLATELET
Absolute Monocytes: 522 cells/uL (ref 200–950)
Basophils Absolute: 17 cells/uL (ref 0–200)
Basophils Relative: 0.3 %
Eosinophils Absolute: 70 cells/uL (ref 15–500)
Eosinophils Relative: 1.2 %
HCT: 40.6 % (ref 35.0–45.0)
Hemoglobin: 13.1 g/dL (ref 11.7–15.5)
Lymphs Abs: 1363 cells/uL (ref 850–3900)
MCH: 28.6 pg (ref 27.0–33.0)
MCHC: 32.3 g/dL (ref 32.0–36.0)
MCV: 88.6 fL (ref 80.0–100.0)
MPV: 10.5 fL (ref 7.5–12.5)
Monocytes Relative: 9 %
Neutro Abs: 3828 cells/uL (ref 1500–7800)
Neutrophils Relative %: 66 %
Platelets: 223 10*3/uL (ref 140–400)
RBC: 4.58 10*6/uL (ref 3.80–5.10)
RDW: 12.7 % (ref 11.0–15.0)
Total Lymphocyte: 23.5 %
WBC: 5.8 10*3/uL (ref 3.8–10.8)

## 2020-08-13 LAB — AMYLASE: Amylase: 51 U/L (ref 21–101)

## 2020-08-13 LAB — LIPID PANEL
Cholesterol: 171 mg/dL (ref <200)
HDL: 57 mg/dL (ref >=50)
LDL Cholesterol (Calc): 89 mg/dL (calc)
Non-HDL Cholesterol (Calc): 114 mg/dL (calc) (ref <130)
Total CHOL/HDL Ratio: 3 (calc) (ref <5.0)
Triglycerides: 146 mg/dL (ref <150)

## 2020-08-13 LAB — LIPASE: Lipase: 52 U/L (ref 7–60)

## 2020-08-13 LAB — SEDIMENTATION RATE: Sed Rate: 11 mm/h (ref 0–30)

## 2020-10-23 ENCOUNTER — Encounter: Payer: Self-pay | Admitting: Family Medicine

## 2020-11-03 ENCOUNTER — Other Ambulatory Visit: Payer: Self-pay

## 2020-11-03 ENCOUNTER — Emergency Department (HOSPITAL_COMMUNITY): Payer: PRIVATE HEALTH INSURANCE

## 2020-11-03 ENCOUNTER — Emergency Department (HOSPITAL_COMMUNITY)
Admission: EM | Admit: 2020-11-03 | Discharge: 2020-11-03 | Disposition: A | Discharge status: Home / Self Care | Payer: PRIVATE HEALTH INSURANCE | Attending: Emergency Medicine | Admitting: Emergency Medicine

## 2020-11-03 ENCOUNTER — Encounter (HOSPITAL_COMMUNITY): Payer: Self-pay | Admitting: Emergency Medicine

## 2020-11-03 ENCOUNTER — Emergency Department (HOSPITAL_BASED_OUTPATIENT_CLINIC_OR_DEPARTMENT_OTHER)
Admit: 2020-11-03 | Discharge: 2020-11-03 | Disposition: A | Discharge status: Home / Self Care | Payer: PRIVATE HEALTH INSURANCE | Attending: Emergency Medicine | Admitting: Emergency Medicine

## 2020-11-03 DIAGNOSIS — R072 Precordial pain: Secondary | ICD-10-CM | POA: Insufficient documentation

## 2020-11-03 DIAGNOSIS — R0789 Other chest pain: Secondary | ICD-10-CM | POA: Diagnosis present

## 2020-11-03 DIAGNOSIS — R110 Nausea: Secondary | ICD-10-CM

## 2020-11-03 DIAGNOSIS — M79604 Pain in right leg: Secondary | ICD-10-CM | POA: Diagnosis not present

## 2020-11-03 DIAGNOSIS — I10 Essential (primary) hypertension: Secondary | ICD-10-CM | POA: Insufficient documentation

## 2020-11-03 LAB — CBC WITH DIFFERENTIAL/PLATELET
Abs Immature Granulocytes: 0.02 10*3/uL (ref 0.00–0.07)
Basophils Absolute: 0 10*3/uL (ref 0.0–0.1)
Basophils Relative: 1 %
Eosinophils Absolute: 0.1 10*3/uL (ref 0.0–0.5)
Eosinophils Relative: 1 %
HCT: 41.2 % (ref 36.0–46.0)
Hemoglobin: 13.2 g/dL (ref 12.0–15.0)
Immature Granulocytes: 0 %
Lymphocytes Relative: 26 %
Lymphs Abs: 1.8 10*3/uL (ref 0.7–4.0)
MCH: 28.8 pg (ref 26.0–34.0)
MCHC: 32 g/dL (ref 30.0–36.0)
MCV: 90 fL (ref 80.0–100.0)
Monocytes Absolute: 0.6 10*3/uL (ref 0.1–1.0)
Monocytes Relative: 9 %
Neutro Abs: 4.2 10*3/uL (ref 1.7–7.7)
Neutrophils Relative %: 63 %
Platelets: 200 10*3/uL (ref 150–400)
RBC: 4.58 MIL/uL (ref 3.87–5.11)
RDW: 12 % (ref 11.5–15.5)
WBC: 6.6 10*3/uL (ref 4.0–10.5)
nRBC: 0 % (ref 0.0–0.2)

## 2020-11-03 LAB — COMPREHENSIVE METABOLIC PANEL
ALT: 30 U/L (ref 0–44)
AST: 26 U/L (ref 15–41)
Albumin: 3.6 g/dL (ref 3.5–5.0)
Alkaline Phosphatase: 97 U/L (ref 38–126)
Anion gap: 7 (ref 5–15)
BUN: 17 mg/dL (ref 6–20)
CO2: 23 mmol/L (ref 22–32)
Calcium: 9.3 mg/dL (ref 8.9–10.3)
Chloride: 108 mmol/L (ref 98–111)
Creatinine, Ser: 0.88 mg/dL (ref 0.44–1.00)
GFR, Estimated: >60 mL/min (ref >60)
Glucose, Bld: 105 mg/dL — ABNORMAL HIGH (ref 70–99)
Potassium: 3.9 mmol/L (ref 3.5–5.1)
Sodium: 138 mmol/L (ref 135–145)
Total Bilirubin: 0.5 mg/dL (ref 0.3–1.2)
Total Protein: 6.7 g/dL (ref 6.5–8.1)

## 2020-11-03 LAB — TROPONIN I (HIGH SENSITIVITY)
Troponin I (High Sensitivity): 3 ng/L (ref <18)
Troponin I (High Sensitivity): 4 ng/L (ref <18)

## 2020-11-03 LAB — LIPASE, BLOOD: Lipase: 49 U/L (ref 11–51)

## 2020-11-03 MED ORDER — DICYCLOMINE HCL 20 MG PO TABS: 20.0000 mg | ORAL_TABLET | Freq: Two times a day (BID) | ORAL | 0 refills | Status: DC

## 2020-11-03 MED ORDER — IOHEXOL 350 MG/ML SOLN
75.0000 mL | Freq: Once | INTRAVENOUS | Status: AC | PRN
  Administered 2020-11-03: 75 mL via INTRAVENOUS

## 2020-11-03 MED ORDER — MORPHINE SULFATE (PF) 4 MG/ML IV SOLN
4.0000 mg | Freq: Once | INTRAVENOUS | Status: AC
Start: 2020-11-03 — End: 2020-11-03
  Administered 2020-11-03: 4 mg via INTRAVENOUS
  Filled 2020-11-03: qty 1

## 2020-11-03 MED ORDER — PANTOPRAZOLE SODIUM 40 MG PO TBEC: 40.0000 mg | DELAYED_RELEASE_TABLET | Freq: Every day | ORAL | 0 refills | Status: DC

## 2020-11-03 MED ORDER — SODIUM CHLORIDE 0.9 % IV BOLUS
500.0000 mL | Freq: Once | INTRAVENOUS | Status: AC
  Administered 2020-11-03: 500 mL via INTRAVENOUS

## 2020-11-03 MED ORDER — NITROGLYCERIN 0.4 MG SL SUBL
0.4000 mg | SUBLINGUAL_TABLET | SUBLINGUAL | Status: DC | PRN
  Administered 2020-11-03: 0.4 mg via SUBLINGUAL
  Filled 2020-11-03: qty 1

## 2020-11-03 MED ORDER — ONDANSETRON HCL 4 MG/2ML IJ SOLN
4.0000 mg | Freq: Once | INTRAMUSCULAR | Status: AC
  Administered 2020-11-03: 4 mg via INTRAVENOUS
  Filled 2020-11-03: qty 2

## 2020-11-03 NOTE — Progress Notes (Signed)
Left lower extremity venous duplex has been completed. Preliminary results can be found in CV Proc through chart review.  Results were given to Silverio Decamp PA.  11/03/20 3:27 PM Beverly Jackson RVT

## 2020-11-03 NOTE — ED Provider Notes (Signed)
Emergency Medicine Provider Triage Evaluation Note  Beverly Jackson , a 55 y.o. female  was evaluated in triage.  Pt complains of left lower leg pain x2 days as well as central chest pain, shortness of breath, nausea that started just prior to arrival to the ED.  History of superficial venous thrombosis in the right lower extremity.  Review of Systems  Positive: Chest pain, nausea, shortness of breath, left lower leg pain Negative: Palpitations, fevers, chills  Physical Exam  BP (!) 150/89 (BP Location: Right Arm)   Pulse 77   Temp 98.6 F (37 C) (Oral)   Resp 17   SpO2 95%  Gen:   Awake, visibly uncomfortable Resp:  Tachypnea without increased work of breathing.  Lungs CTA B. MSK:   Moves extremities without difficulty  Other:  RRR no M/R/G.  Medical Decision Making  Medically screening exam initiated at 2:56 PM.  Appropriate orders placed.  Shenaya Anschutz was informed that the remainder of the evaluation will be completed by another provider, this initial triage assessment does not replace that evaluation, and the importance of remaining in the ED until their evaluation is complete.  This chart was dictated using voice recognition software, Dragon. Despite the best efforts of this provider to proofread and correct errors, errors may still occur which can change documentation meaning.    Emeline Darling, PA-C 11/03/20 1457    Margette Fast, MD 11/06/20 [phone]

## 2020-11-03 NOTE — Discharge Instructions (Signed)
You were seen in the emergency room today with abdominal and chest pain.  Your lab work and CT imaging here is reassuring.  As we discussed, I have called in 2 medications for your symptoms.  You take the Protonix every day and the Bentyl as needed for pain.  You may also take over-the-counter Maalox if you have symptoms.  Please follow closely with your primary care doctor.  Please discuss if you would benefit from seeing a GI doctor for upper endoscopy.  I have listed the name of the group on-call but your primary care doctor may need to make a referral.  If he develop any new or suddenly worsening symptoms please return to the emergency department for reevaluation.

## 2020-11-03 NOTE — ED Provider Notes (Signed)
Emergency Department Provider Note   I have reviewed the triage vital signs and the nursing notes.   HISTORY  Chief Complaint Chest Pain   HPI Beverly Jackson is a 55 y.o. female with past medical history reviewed below presents emergency department with chest discomfort starting suddenly while at lunch today.  She has some central chest discomfort radiating into the lower abdomen.  Pain feels like a pressure but also stabbing at times.  She does feel some subjective shortness of breath but denies fevers or chills.  She notes prior history of superficial VTE but did not require anticoagulation. She felt some associated discomfort in the left lower leg which reminded her of this pain. She also notes a history of pancreatitis in the past but denies vomiting with this episode. She has had some nausea.   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   Edema, lower extremity    Gallbladder disease    Hypertension    Joint pain    Low blood sugar    MVP (mitral valve prolapse)    Obesity    Osteoarthritis    Sleep apnea    Thyroid disease    Vitamin D deficiency     Patient Active Problem List   Diagnosis Date Noted   Dyspepsia 11/05/2020   Anxiety and depression 11/05/2020   History of migraine headaches 11/05/2020   Pancreatitis 08/12/2020   Obstructive sleep apnea 06/08/2019   Prediabetes 03/07/2019   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   History of nephrolithiasis 09/27/2018   History of hysterectomy for benign disease 09/27/2018   Edema of left lower extremity 09/27/2018   History of breast cancer 08/23/2018   Annual physical exam 08/23/2018   Primary osteoarthritis of both knees 08/23/2018   Hypothyroidism (acquired) 01/08/2015   Morbid obesity with BMI of 45.0-49.9, adult ([place]) 01/08/2015    Past Surgical History:  Procedure Laterality Date   ABDOMINAL HYSTERECTOMY      BREAST SURGERY     CESAREAN SECTION     CHOLECYSTECTOMY      Allergies Mixed ragweed, Penicillins, and Short ragweed pollen ext  Family History  Problem Relation Age of Onset   Hypertension Mother    Depression Mother    Atrial fibrillation Mother    Thyroid disease Mother    Hyperlipidemia Mother    Stroke Mother    Cancer Mother    Obesity Mother    Cancer Father     Social History Social History   Tobacco Use   Smoking status: Never   Smokeless tobacco: Never  Substance Use Topics   Alcohol use: Never   Drug use: Never    Review of Systems  Constitutional: No fever/chills Eyes: No visual changes. ENT: No sore throat. Cardiovascular: Denies chest pain. Respiratory: Denies shortness of breath. Gastrointestinal: Positive abdominal pain. Positive nausea and vomiting.  No diarrhea.  No constipation. Genitourinary: Negative for dysuria. Musculoskeletal: Negative for back pain. Positive left leg pain.  Skin: Negative for rash.  Neurological: Negative for headaches, focal weakness or numbness.  10-point ROS otherwise negative.  ____________________________________________   PHYSICAL EXAM:  VITAL SIGNS: ED Triage Vitals  Enc Vitals Group     BP 11/03/20 1446 (!) 150/89     Pulse Rate 11/03/20 1446 77     Resp 11/03/20 1446 17     Temp 11/03/20 1446 98.6 F (  37 C)     Temp Source 11/03/20 1446 Oral     SpO2 11/03/20 1446 95 %   Constitutional: Alert and oriented. Well appearing and in no acute distress. Eyes: Conjunctivae are normal.  Head: Atraumatic. Nose: No congestion/rhinnorhea. Mouth/Throat: Mucous membranes are moist.  Neck: No stridor.   Cardiovascular: Normal rate, regular rhythm. Good peripheral circulation. Grossly normal heart sounds.   Respiratory: Normal respiratory effort.  No retractions. Lungs CTAB. Gastrointestinal: Soft and nontender. No distention.  Musculoskeletal: No lower extremity tenderness nor edema. No gross deformities  of extremities. Neurologic:  Normal speech and language. No gross focal neurologic deficits are appreciated.  Skin:  Skin is warm, dry and intact. No rash noted.  ____________________________________________   LABS (all labs ordered are listed, but only abnormal results are displayed)  Labs Reviewed  COMPREHENSIVE METABOLIC PANEL - Abnormal; Notable for the following components:      Result Value   Glucose, Bld 105 (*)    All other components within normal limits  CBC WITH DIFFERENTIAL/PLATELET  LIPASE, BLOOD  TROPONIN I (HIGH SENSITIVITY)  TROPONIN I (HIGH SENSITIVITY)   ____________________________________________  EKG   EKG Interpretation  Date/Time:  Sunday November 03 2020 14:43:52 EDT Ventricular Rate:  79 PR Interval:  172 QRS Duration: 76 QT Interval:  374 QTC Calculation: 428 R Axis:   -9 Text Interpretation: Normal sinus rhythm Cannot rule out Anterior infarct , age undetermined Abnormal ECG No old tracing to compare Confirmed by Nanda Quinton [phone]) on 11/03/2020 5:01:20 PM        ____________________________________________  RADIOLOGY  DG Chest 2 View  Result Date: 11/03/2020 CLINICAL DATA:  Chest pain, center of chest with nausea and shortness of breath. EXAM: CHEST - 2 VIEW COMPARISON:  None FINDINGS: Lung volumes low-normal. Accounting for this cardiomediastinal contours and hilar structures are normal. No lobar consolidation. No visible pneumothorax. No sign of pleural effusion. On limited assessment there are no acute skeletal abnormalities. IMPRESSION: Mildly low lung volumes without acute cardiopulmonary disease. Electronically Signed   By: Zetta Bills M.D.   On: 11/03/2020 16:27   VAS [place] LOWER EXTREMITY VENOUS (DVT) ([facility] and [facility] 7a-7p)  Result Date: 11/03/2020  Lower Venous DVT Study Patient Name:  Beverly Jackson  Date of Exam:   11/03/2020 Medical Rec #: [id number]    Accession #:    [id number] Date of Birth: October 09, 1965     Patient Gender: F Patient Age:    25 years Exam Location[facility]  [facility] Procedure:      VAS [place] LOWER EXTREMITY VENOUS (DVT) Referring Phys: [facility] SPONSELLER --------------------------------------------------------------------------------  Indications: Pain.  Risk Factors: None identified. Limitations: Body habitus and poor ultrasound/tissue interface. Comparison Study: No prior studies. Performing Technologist: Oliver Hum RVT  Examination Guidelines: A complete evaluation includes B-mode imaging, spectral Doppler, color Doppler, and power Doppler as needed of all accessible portions of each vessel. Bilateral testing is considered an integral part of a complete examination. Limited examinations for reoccurring indications may be performed as noted. The reflux portion of the exam is performed with the patient in reverse Trendelenburg.  +-----+---------------+---------+-----------+----------+--------------+ RIGHTCompressibilityPhasicitySpontaneityPropertiesThrombus Aging +-----+---------------+---------+-----------+----------+--------------+ CFV  Full           Yes      Yes                                 +-----+---------------+---------+-----------+----------+--------------+   +---------+---------------+---------+-----------+----------+--------------+ LEFT     CompressibilityPhasicitySpontaneityPropertiesThrombus Aging +---------+---------------+---------+-----------+----------+--------------+  CFV      Full           Yes      Yes                                 +---------+---------------+---------+-----------+----------+--------------+ SFJ      Full                                                        +---------+---------------+---------+-----------+----------+--------------+ FV Prox  Full                                                        +---------+---------------+---------+-----------+----------+--------------+ FV Mid   Full                                                         +---------+---------------+---------+-----------+----------+--------------+ FV DistalFull                                                        +---------+---------------+---------+-----------+----------+--------------+ PFV      Full                                                        +---------+---------------+---------+-----------+----------+--------------+ POP      Full           Yes      Yes                                 +---------+---------------+---------+-----------+----------+--------------+ PTV      Full                                                        +---------+---------------+---------+-----------+----------+--------------+ PERO     Full                                                        +---------+---------------+---------+-----------+----------+--------------+    Summary: RIGHT: - No evidence of common femoral vein obstruction.  LEFT: - There is no evidence of deep vein thrombosis in the lower extremity.  - No cystic structure found in the popliteal fossa.  *See table(s) above for measurements and observations.    Preliminary     ____________________________________________  PROCEDURES  Procedure(s) performed:   Procedures  None  ____________________________________________   INITIAL IMPRESSION / ASSESSMENT AND PLAN / ED COURSE  Pertinent labs & imaging results that were available during my care of the patient were reviewed by me and considered in my medical decision making (see chart for details).   Patient presents to the emergency department with central chest discomfort and leg pain.  No DVT on ultrasound performed through the MSE process.  Chest x-ray is clear and lab work is overall reassuring.  Patient describes prior history of what sounds like superficial thrombophlebitis but described in triage as a DVT.  She is not anticoagulated.  Her pain symptoms were sudden onset and associated with some shortness of breath.  Do plan  for CT imaging of the chest to rule out PE.  Lipase is within normal limits.  Abdomen is not particularly tender on exam.   Differential diagnosis includes but is not exclusive to acute cholecystitis, intrathoracic causes for epigastric abdominal pain, gastritis, duodenitis, pancreatitis, small bowel or large bowel obstruction, abdominal aortic aneurysm, hernia, gastritis, etc.  CTA PE reviewed. No PE or other acute process. Plan for PPI and other symptom mgmt with plan for PCP follow up to discuss possible GI referral. Discussed strict ED return precautions, however, with worsening CP although overall low suspicion for ACS.  ____________________________________________  FINAL CLINICAL IMPRESSION(S) / ED DIAGNOSES  Final diagnoses:  Precordial chest pain  Nausea     MEDICATIONS GIVEN DURING THIS VISIT:  Medications  morphine 4 MG/ML injection 4 mg (4 mg Intravenous Given 11/03/20 1752)  ondansetron (ZOFRAN) injection 4 mg (4 mg Intravenous Given 11/03/20 1756)  sodium chloride 0.9 % bolus 500 mL (0 mLs Intravenous Stopped 11/03/20 1836)  iohexol (OMNIPAQUE) 350 MG/ML injection 75 mL (75 mLs Intravenous Contrast Given 11/03/20 1823)     NEW OUTPATIENT MEDICATIONS STARTED DURING THIS VISIT:  Discharge Medication List as of 11/03/2020  9:13 PM     START taking these medications   Details  dicyclomine (BENTYL) 20 MG tablet Take 1 tablet (20 mg total) by mouth 2 (two) times daily., Starting Sun 11/03/2020, Normal    pantoprazole (PROTONIX) 40 MG tablet Take 1 tablet (40 mg total) by mouth daily., Starting Sun 11/03/2020, Until Tue 12/03/2020, Normal        Note:  This document was prepared using Dragon voice recognition software and may include unintentional dictation errors.  Nanda Quinton, MD, [facility] Emergency Medicine    Kierstan Auer, Wonda [facility], MD 11/06/20 [phone]

## 2020-11-03 NOTE — ED Triage Notes (Signed)
Pt reports pain to center of chest with nausea and SOB that started just PTA.  Reports L anterior lower leg pain, warmth, and swelling that started yesterday.  History of DVT R leg.

## 2020-11-05 ENCOUNTER — Encounter: Payer: Self-pay | Admitting: Sports Medicine

## 2020-11-05 ENCOUNTER — Other Ambulatory Visit: Payer: Self-pay

## 2020-11-05 ENCOUNTER — Ambulatory Visit (INDEPENDENT_AMBULATORY_CARE_PROVIDER_SITE_OTHER): Payer: PRIVATE HEALTH INSURANCE | Admitting: Sports Medicine

## 2020-11-05 DIAGNOSIS — M170 Bilateral primary osteoarthritis of knee: Secondary | ICD-10-CM

## 2020-11-05 DIAGNOSIS — Z8669 Personal history of other diseases of the nervous system and sense organs: Secondary | ICD-10-CM

## 2020-11-05 DIAGNOSIS — R1013 Epigastric pain: Secondary | ICD-10-CM

## 2020-11-05 DIAGNOSIS — F419 Anxiety disorder, unspecified: Secondary | ICD-10-CM | POA: Diagnosis not present

## 2020-11-05 DIAGNOSIS — G4486 Cervicogenic headache: Secondary | ICD-10-CM

## 2020-11-05 DIAGNOSIS — F32A Depression, unspecified: Secondary | ICD-10-CM

## 2020-11-05 MED ORDER — RIZATRIPTAN BENZOATE 5 MG PO TBDP: 5.0000 mg | ORAL_TABLET | ORAL | 0 refills | Status: DC | PRN

## 2020-11-05 MED ORDER — PANTOPRAZOLE SODIUM 40 MG PO TBEC: 40.0000 mg | DELAYED_RELEASE_TABLET | Freq: Every day | ORAL | 3 refills | Status: DC

## 2020-11-05 MED ORDER — CELECOXIB 100 MG PO CAPS: ORAL_CAPSULE | ORAL | 3 refills | Status: DC

## 2020-11-05 MED ORDER — SERTRALINE HCL 100 MG PO TABS: 100.0000 mg | ORAL_TABLET | Freq: Every day | ORAL | 3 refills | Status: DC

## 2020-11-05 NOTE — Assessment & Plan Note (Signed)
Beverly Jackson does have underlying anxiety and depression with superimposed adjustment disorder/grieving from the passing of her father. She is currently on Zoloft 50 mg daily, she is having some increasing stress, anxiety, forgetfulness. Increasing Zoloft to 100 mg daily, we will revisit this in 6 weeks

## 2020-11-05 NOTE — Assessment & Plan Note (Signed)
Bilateral knee osteoarthritis, historically well managed with meloxicam, with her epigastric discomfort and suspected dyspepsia/peptic ulcer disease we will switch her off of meloxicam and switch to low-dose Celebrex.

## 2020-11-05 NOTE — Assessment & Plan Note (Signed)
Beverly Jackson is a pleasant 55 year old female, she is here for an emergency department follow-up, she typically drinks 1 to 2 cups of coffee per day, she recalls going to lunch at [employer], having another cup of coffee, and then taking a couple of extra strength aspirins. She then developed discomfort in the epigastrium radiating to the back, the pain was severe so she presented to the emergency department, in the ED she had lower extremity Dopplers, CT angiography, ECG, labs including cardiac enzymes, amylase, lipase, all of the above were negative. She was started on pantoprazole and dicyclomine. On further questioning she does not have any discomfort with physical activity, going up or down stairs, no shortness of breath, no melena, hematochezia, hematemesis. No progressive weight loss. Discontinue dicyclomine, continue pantoprazole for 6 weeks, we will do a trial off of pantoprazole at the 6-week point. We do need to discontinue meloxicam, switching to low-dose Celebrex.

## 2020-11-05 NOTE — Assessment & Plan Note (Signed)
Migraines historically well controlled with topiramate, really does not have to use her Maxalt but would like a refill.

## 2020-11-05 NOTE — Progress Notes (Signed)
    Procedures performed today:    None.  Independent interpretation of notes and tests performed by another provider:   None.  Brief History, Exam, Impression, and Recommendations:    Dyspepsia Beverly Jackson is a pleasant 55 year old female, she is here for an emergency department follow-up, she typically drinks 1 to 2 cups of coffee per day, she recalls going to lunch at [employer], having another cup of coffee, and then taking a couple of extra strength aspirins. She then developed discomfort in the epigastrium radiating to the back, the pain was severe so she presented to the emergency department, in the ED she had lower extremity Dopplers, CT angiography, ECG, labs including cardiac enzymes, amylase, lipase, all of the above were negative. She was started on pantoprazole and dicyclomine. On further questioning she does not have any discomfort with physical activity, going up or down stairs, no shortness of breath, no melena, hematochezia, hematemesis. No progressive weight loss. Discontinue dicyclomine, continue pantoprazole for 6 weeks, we will do a trial off of pantoprazole at the 6-week point. We do need to discontinue meloxicam, switching to low-dose Celebrex.  Anxiety and depression Beverly Jackson does have underlying anxiety and depression with superimposed adjustment disorder/grieving from the passing of her father. She is currently on Zoloft 50 mg daily, she is having some increasing stress, anxiety, forgetfulness. Increasing Zoloft to 100 mg daily, we will revisit this in 6 weeks  Primary osteoarthritis of both knees Bilateral knee osteoarthritis, historically well managed with meloxicam, with her epigastric discomfort and suspected dyspepsia/peptic ulcer disease we will switch her off of meloxicam and switch to low-dose Celebrex.    ___________________________________________ [name]. [name], M.D., ABFM., CAQSM. Primary Care and [place] Instructor of [place] of [facility]

## 2020-12-09 ENCOUNTER — Encounter: Payer: Self-pay | Admitting: Family Medicine

## 2020-12-09 ENCOUNTER — Telehealth (INDEPENDENT_AMBULATORY_CARE_PROVIDER_SITE_OTHER): Payer: PRIVATE HEALTH INSURANCE | Admitting: Family Medicine

## 2020-12-09 DIAGNOSIS — J0100 Acute maxillary sinusitis, unspecified: Secondary | ICD-10-CM | POA: Diagnosis not present

## 2020-12-09 MED ORDER — PREDNISONE 20 MG PO TABS: 20.0000 mg | ORAL_TABLET | Freq: Two times a day (BID) | ORAL | 0 refills | Status: AC

## 2020-12-09 MED ORDER — CEFDINIR 300 MG PO CAPS: 300.0000 mg | ORAL_CAPSULE | Freq: Two times a day (BID) | ORAL | 0 refills | Status: AC

## 2020-12-09 NOTE — Assessment & Plan Note (Signed)
Starting cefdinir 300 mg twice daily x7 days.  We will also add course of prednisone 20 mg twice daily x5 days.  Instructed to increase fluid intake.  She may use Tylenol as needed for aches pains and/or fever.

## 2020-12-09 NOTE — Progress Notes (Signed)
Woke up and eyes were matted together.   Used warm compresses to clean her eyes.   Been sick since last Tuesday. Home COVID = negative Lost her voice. Temp 99.1 last week now 97.9 Cough

## 2020-12-09 NOTE — Progress Notes (Signed)
Beverly Jackson - 55 y.o. female MRN [id number]  Date of birth: [birth year]   This visit type was conducted due to national recommendations for restrictions regarding the COVID-19 Pandemic (e.g. social distancing).  This format is felt to be most appropriate for this patient at this time.  All issues noted in this document were discussed and addressed.  No physical exam was performed (except for noted visual exam findings with Video Visits).  I discussed the limitations of evaluation and management by telemedicine and the availability of in person appointments. The patient expressed understanding and agreed to proceed.  I connected withNAME@ on 12/09/20 at  2:30 PM EDT by a video enabled telemedicine application and verified that I am speaking with the correct person using two identifiers.  Present at visit: Luetta Nutting, DO [place]   Patient Location: [place] [address]   Provider location:   [facility]  Chief Complaint  Patient presents with   Eye Drainage    HPI  Beverly Jackson is a 55 y.o. female who presents via audio/video conferencing for a telehealth visit today.  Beverly Jackson is a 55 year old female with complaint of nasal congestion, hoarseness, mild cough, postnasal drainage, eye redness and irritation.  Mucus is thick and yellow.  She is having some sinus pain.  She has not had fever, chills, nausea, vomiting or diarrhea.  She did have a negative COVID test.   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   Edema, lower extremity    Gallbladder disease    Hypertension    Joint pain    Low blood sugar    MVP (mitral valve prolapse)    Obesity    Osteoarthritis    Sleep apnea    Thyroid disease    Vitamin D deficiency     Past Surgical History:  Procedure Laterality Date   ABDOMINAL  HYSTERECTOMY     BREAST SURGERY     CESAREAN SECTION     CHOLECYSTECTOMY      Family History  Problem Relation Age of Onset   Hypertension Mother    Depression Mother    Atrial fibrillation Mother    Thyroid disease Mother    Hyperlipidemia Mother    Stroke Mother    Cancer Mother    Obesity Mother    Cancer Father     Social History   Socioeconomic History   Marital status: Unknown    Spouse name: phillip   Number of children: Not on file   Years of education: Not on file   Highest education level: Not on file  Occupational History   Occupation: Hair Dresser    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  Tobacco Use   Smoking status: Never   Smokeless tobacco: Never  Substance and Sexual Activity   Alcohol use: Never   Drug use: Never   Sexual activity: Never  Other Topics Concern   Not on file  Social History Narrative   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 Use up to two times daily as directed. (FOR ICD-9 250.00, 250.01)., Disp: 1 each, Rfl: 0   cefdinir (OMNICEF) 300 MG capsule, Take 1 capsule (300 mg total) by mouth 2 (two) times daily for 7 days., Disp: 14 capsule, Rfl: 0   celecoxib (CELEBREX) 100 MG capsule, 1 to 2 capsules daily, Disp: 60 capsule, Rfl: 3   furosemide (LASIX) 20 MG tablet, TAKE ONE TABLET BY MOUTH DAILY, Disp: 90 tablet, Rfl: 3   glucose blood (ACCU-CHEK AVIVA PLUS) test strip, Twice daily, Disp: 100 each, Rfl: 0   Lancets (ACCU-CHEK SAFE-T PRO) lancets, Twice daily, Disp: 100 each, Rfl: 12   levothyroxine (SYNTHROID) 50 MCG  tablet, Take 1 tablet (50 mcg total) by mouth daily., Disp: 90 tablet, Rfl: 3   pantoprazole (PROTONIX) 40 MG tablet, Take 1 tablet (40 mg total) by mouth daily., Disp: 90 tablet, Rfl: 3   predniSONE (DELTASONE) 20 MG tablet, Take 1 tablet (20 mg total) by mouth 2 (two) times daily with a meal for 5 days., Disp: 10 tablet, Rfl: 0   rizatriptan (MAXALT-MLT) 5 MG disintegrating tablet, Take 1 tablet (5 mg total) by mouth as needed for migraine. May repeat in 2 hours if needed, Disp: 10 tablet, Rfl: 0   sertraline (ZOLOFT) 100 MG tablet, Take 1 tablet (100 mg total) by mouth daily., Disp: 30 tablet, Rfl: 3   spironolactone (ALDACTONE) 50 MG tablet, TAKE ONE TABLET BY MOUTH DAILY, Disp: 90 tablet, Rfl: 3   tamoxifen (NOLVADEX) 20 MG tablet, Take 20 mg by mouth daily., Disp: , Rfl:    topiramate (TOPAMAX) 50 MG tablet, Take 1 tablet (50 mg total) by mouth daily., Disp: , Rfl:   EXAM:  VITALS per patient if applicable: Temp 75.7 F (36.6 C)   Ht 5' 2"  (1.575 m)   Wt 257 lb 12.8 oz (116.9 kg)   BMI 47.15 kg/m   GENERAL: alert, oriented, appears well and in no acute distress  HEENT: atraumatic, conjunttiva clear, no obvious abnormalities on inspection of external nose and ears  NECK: normal movements of the head and neck  LUNGS: on inspection no signs of respiratory distress, breathing rate appears normal, no obvious gross SOB, gasping or wheezing  CV: no obvious cyanosis  MS: moves all visible extremities without noticeable abnormality  PSYCH/NEURO: pleasant and cooperative, no obvious depression or anxiety, speech and thought processing grossly intact  ASSESSMENT AND PLAN:  Discussed the following assessment and plan:  Acute maxillary sinusitis Starting cefdinir 300 mg twice daily x7 days.  We will also add course of prednisone 20 mg twice daily x5 days.  Instructed to increase fluid intake.  She may use Tylenol as needed for aches pains and/or fever.     I discussed the assessment  and treatment plan with the patient. The patient was provided an opportunity to ask questions and all were answered. The patient agreed with the plan and demonstrated an understanding of the instructions.   The patient was advised to call back or seek an in-person evaluation if the symptoms worsen or if the condition fails to improve as anticipated.    Luetta Nutting, DO

## 2020-12-16 ENCOUNTER — Other Ambulatory Visit: Payer: Self-pay

## 2020-12-16 ENCOUNTER — Ambulatory Visit (INDEPENDENT_AMBULATORY_CARE_PROVIDER_SITE_OTHER): Payer: PRIVATE HEALTH INSURANCE | Admitting: Sports Medicine

## 2020-12-16 DIAGNOSIS — F32A Depression, unspecified: Secondary | ICD-10-CM | POA: Diagnosis not present

## 2020-12-16 DIAGNOSIS — M170 Bilateral primary osteoarthritis of knee: Secondary | ICD-10-CM

## 2020-12-16 DIAGNOSIS — F419 Anxiety disorder, unspecified: Secondary | ICD-10-CM

## 2020-12-16 DIAGNOSIS — R1013 Epigastric pain: Secondary | ICD-10-CM

## 2020-12-16 MED ORDER — CELECOXIB 200 MG PO CAPS: ORAL_CAPSULE | ORAL | 2 refills | Status: DC

## 2020-12-16 NOTE — Assessment & Plan Note (Signed)
Improved considerably with pantoprazole and switching from meloxicam to low-dose Celebrex. At this point she has been on pantoprazole for 6 weeks so we can discontinue, if recurrence of symptoms we will set her up for upper endoscopy.

## 2020-12-16 NOTE — Assessment & Plan Note (Signed)
Has only been doing the 100 mg of Celebrex, we will go up to 200 to 400 mg daily. She will keep an eye out for any increase symptoms of dyspepsia.

## 2020-12-16 NOTE — Assessment & Plan Note (Signed)
Well-controlled now on current dose of Zoloft. No change in plan, return in 4 to 6 months.

## 2020-12-16 NOTE — Progress Notes (Signed)
    Procedures performed today:    None.  Independent interpretation of notes and tests performed by another provider:   None.  Brief History, Exam, Impression, and Recommendations:    Anxiety and depression Well-controlled now on current dose of Zoloft. No change in plan, return in 4 to 6 months.  Dyspepsia Improved considerably with pantoprazole and switching from meloxicam to low-dose Celebrex. At this point she has been on pantoprazole for 6 weeks so we can discontinue, if recurrence of symptoms we will set her up for upper endoscopy.  Primary osteoarthritis of both knees Has only been doing the 100 mg of Celebrex, we will go up to 200 to 400 mg daily. She will keep an eye out for any increase symptoms of dyspepsia.  Chronic process not at goal/exacerbation with pharmacologic intervention.  ___________________________________________ [name]. [name], M.D., ABFM., CAQSM. Primary Care and [place] Instructor of [place] of [facility] of Medicine

## 2020-12-26 ENCOUNTER — Other Ambulatory Visit: Payer: Self-pay | Admitting: Sports Medicine

## 2020-12-26 ENCOUNTER — Telehealth: Payer: Self-pay

## 2020-12-26 MED ORDER — FLUCONAZOLE 150 MG PO TABS: 150.0000 mg | ORAL_TABLET | Freq: Once | ORAL | 3 refills | Status: AC

## 2020-12-26 NOTE — Telephone Encounter (Signed)
Patient had video visit with Dr. Zigmund Daniel 12/09/20 for sinus infection.She has taken Cefdinir and now has a yeast infection. Advised she may need appt for this but she wants to know if you will send something in ?

## 2020-12-26 NOTE — Telephone Encounter (Signed)
Pt informed of RX.  T. Savi Lastinger, CMA 

## 2020-12-26 NOTE — Telephone Encounter (Signed)
No appointment needed, sending in [place].

## 2021-01-01 ENCOUNTER — Other Ambulatory Visit: Payer: Self-pay

## 2021-01-01 ENCOUNTER — Ambulatory Visit (INDEPENDENT_AMBULATORY_CARE_PROVIDER_SITE_OTHER): Payer: Self-pay

## 2021-01-01 ENCOUNTER — Ambulatory Visit (INDEPENDENT_AMBULATORY_CARE_PROVIDER_SITE_OTHER): Payer: PRIVATE HEALTH INSURANCE | Admitting: Sports Medicine

## 2021-01-01 DIAGNOSIS — R059 Cough, unspecified: Secondary | ICD-10-CM

## 2021-01-01 DIAGNOSIS — R5383 Other fatigue: Secondary | ICD-10-CM

## 2021-01-01 DIAGNOSIS — R7303 Prediabetes: Secondary | ICD-10-CM

## 2021-01-01 DIAGNOSIS — F419 Anxiety disorder, unspecified: Secondary | ICD-10-CM

## 2021-01-01 DIAGNOSIS — F32A Depression, unspecified: Secondary | ICD-10-CM

## 2021-01-01 NOTE — Progress Notes (Addendum)
    Procedures performed today:    None.  Independent interpretation of notes and tests performed by another provider:   None.  Brief History, Exam, Impression, and Recommendations:    Anxiety and depression Beverly Jackson returns, she was historically having some depressive and anxiety symptoms, became well controlled on Zoloft 100 mg daily. She would like to taper down off of this, I think this is acceptable, she is complaining of shakes, muscle aches, body aches, sweatiness. She thinks this may be due to the Zoloft however sweating, achiness, fatigue is common with tamoxifen as well which was started about the same time, tamoxifen does induce a menopausal type syndrome even in those that are postmenopausal. We will do 50 mg of Zoloft for a week, 25 for a week and then stop and then reevaluate symptomatology.  Fatigue Nonspecific fatigue, please see above. She is approximately 3 weeks post a significant viral process, potentially influenza and still has a bit of a cough, I think this is still a postinfectious cough, we will add a chest x-ray as well. We will do a serum work-up.  Prediabetes Blood sugar elevated, adding a hemoglobin A1c.  I spent 30 minutes of total time managing this patient today, this includes chart review, face to face, and non-face to face time.  Specifically we discussed the differential diagnoses here and some of the work-up for nonspecific symptoms such as fatigue.   ___________________________________________ [name]. [name], M.D., ABFM., CAQSM. Primary Care and [place] Instructor of [place] of [facility] of Medicine

## 2021-01-01 NOTE — Assessment & Plan Note (Signed)
Beverly Jackson returns, she was historically having some depressive and anxiety symptoms, became well controlled on Zoloft 100 mg daily. She would like to taper down off of this, I think this is acceptable, she is complaining of shakes, muscle aches, body aches, sweatiness. She thinks this may be due to the Zoloft however sweating, achiness, fatigue is common with tamoxifen as well which was started about the same time, tamoxifen does induce a menopausal type syndrome even in those that are postmenopausal. We will do 50 mg of Zoloft for a week, 25 for a week and then stop and then reevaluate symptomatology.

## 2021-01-01 NOTE — Assessment & Plan Note (Signed)
Nonspecific fatigue, please see above. She is approximately 3 weeks post a significant viral process, potentially influenza and still has a bit of a cough, I think this is still a postinfectious cough, we will add a chest x-ray as well. We will do a serum work-up.

## 2021-01-02 NOTE — Addendum Note (Signed)
Addended by: Silverio Decamp on: 01/02/2021 08:45 AM   Modules accepted: Orders

## 2021-01-02 NOTE — Assessment & Plan Note (Signed)
Blood sugar elevated, adding a hemoglobin A1c.

## 2021-01-03 LAB — COMPREHENSIVE METABOLIC PANEL
AG Ratio: 1.5 (calc) (ref 1.0–2.5)
ALT: 34 U/L — ABNORMAL HIGH (ref 6–29)
AST: 25 U/L (ref 10–35)
Albumin: 4.1 g/dL (ref 3.6–5.1)
Alkaline phosphatase (APISO): 114 U/L (ref 37–153)
BUN: 20 mg/dL (ref 7–25)
CO2: 22 mmol/L (ref 20–32)
Calcium: 9.4 mg/dL (ref 8.6–10.4)
Chloride: 107 mmol/L (ref 98–110)
Creat: 0.81 mg/dL (ref 0.50–1.03)
Globulin: 2.7 g/dL (calc) (ref 1.9–3.7)
Glucose, Bld: 153 mg/dL — ABNORMAL HIGH (ref 65–139)
Potassium: 3.8 mmol/L (ref 3.5–5.3)
Sodium: 141 mmol/L (ref 135–146)
Total Bilirubin: 0.4 mg/dL (ref 0.2–1.2)
Total Protein: 6.8 g/dL (ref 6.1–8.1)

## 2021-01-03 LAB — CBC
HCT: 42.4 % (ref 35.0–45.0)
Hemoglobin: 13.8 g/dL (ref 11.7–15.5)
MCH: 28.2 pg (ref 27.0–33.0)
MCHC: 32.5 g/dL (ref 32.0–36.0)
MCV: 86.5 fL (ref 80.0–100.0)
MPV: 10.7 fL (ref 7.5–12.5)
Platelets: 200 10*3/uL (ref 140–400)
RBC: 4.9 10*6/uL (ref 3.80–5.10)
RDW: 12.4 % (ref 11.0–15.0)
WBC: 5 10*3/uL (ref 3.8–10.8)

## 2021-01-03 LAB — TEST AUTHORIZATION

## 2021-01-03 LAB — VITAMIN D 25 HYDROXY (VIT D DEFICIENCY, FRACTURES): Vit D, 25-Hydroxy: 61 ng/mL (ref 30–100)

## 2021-01-03 LAB — BRAIN NATRIURETIC PEPTIDE: Brain Natriuretic Peptide: 9 pg/mL (ref <100)

## 2021-01-03 LAB — TSH: TSH: 1.66 mIU/L

## 2021-01-03 LAB — HEMOGLOBIN A1C W/OUT EAG: Hgb A1c MFr Bld: 6 % of total Hgb — ABNORMAL HIGH (ref <5.7)

## 2021-01-03 LAB — CK: Total CK: 48 U/L (ref 29–143)

## 2021-02-07 ENCOUNTER — Other Ambulatory Visit: Payer: Self-pay | Admitting: Sports Medicine

## 2021-02-10 ENCOUNTER — Ambulatory Visit (INDEPENDENT_AMBULATORY_CARE_PROVIDER_SITE_OTHER): Payer: PRIVATE HEALTH INSURANCE | Admitting: Sports Medicine

## 2021-02-10 ENCOUNTER — Other Ambulatory Visit: Payer: Self-pay

## 2021-02-10 DIAGNOSIS — N3946 Mixed incontinence: Secondary | ICD-10-CM

## 2021-02-10 DIAGNOSIS — F32A Depression, unspecified: Secondary | ICD-10-CM

## 2021-02-10 DIAGNOSIS — F419 Anxiety disorder, unspecified: Secondary | ICD-10-CM | POA: Diagnosis not present

## 2021-02-10 DIAGNOSIS — R32 Unspecified urinary incontinence: Secondary | ICD-10-CM | POA: Insufficient documentation

## 2021-02-10 DIAGNOSIS — R053 Chronic cough: Secondary | ICD-10-CM | POA: Insufficient documentation

## 2021-02-10 LAB — POCT URINALYSIS DIP (CLINITEK)
Bilirubin, UA: NEGATIVE
Blood, UA: NEGATIVE
Glucose, UA: NEGATIVE mg/dL
Ketones, POC UA: NEGATIVE mg/dL
Nitrite, UA: NEGATIVE
POC PROTEIN,UA: NEGATIVE
Spec Grav, UA: 1.02 (ref 1.010–1.025)
Urobilinogen, UA: 0.2 E.U./dL
pH, UA: 5.5 (ref 5.0–8.0)

## 2021-02-10 MED ORDER — NITROFURANTOIN MONOHYD MACRO 100 MG PO CAPS: 100.0000 mg | ORAL_CAPSULE | Freq: Two times a day (BID) | ORAL | 0 refills | Status: DC

## 2021-02-10 NOTE — Assessment & Plan Note (Signed)
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 We discussed Kegel exercises as well as the anatomy and urodynamics associated with stress and urge incontinence, urinalysis did show some leukocytes today so we will go ahead and treat this with antibiotics. I would like her to also work with pelvic floor physical therapy.

## 2021-02-10 NOTE — Assessment & Plan Note (Signed)
Worsening symptoms, restarting Zoloft.

## 2021-02-10 NOTE — Progress Notes (Signed)
° ° °  Procedures performed today:    None.  Independent interpretation of notes and tests performed by another provider:   None.  Brief History, Exam, Impression, and Recommendations:    Anxiety and depression Worsening symptoms, restarting Zoloft.  Chronic cough Coughing for over 2 months. Chest x-ray unrevealing with the exception of atelectasis, history of breast cancer, adding CT chest with contrast. If unrevealing we will consider either PFTs or referral to pulmonology.  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 We discussed Kegel exercises as well as the anatomy and urodynamics associated with stress and urge incontinence, urinalysis did show some leukocytes today so we will go ahead and treat this with antibiotics. I would like her to also work with pelvic floor physical therapy.    ___________________________________________ [name]. [name], M.D., ABFM., CAQSM. Primary Care and [place] Instructor of [place] of [facility] of Medicine

## 2021-02-10 NOTE — Assessment & Plan Note (Signed)
Coughing for over 2 months. Chest x-ray unrevealing with the exception of atelectasis, history of breast cancer, adding CT chest with contrast. If unrevealing we will consider either PFTs or referral to pulmonology.

## 2021-02-12 ENCOUNTER — Other Ambulatory Visit: Payer: PRIVATE HEALTH INSURANCE

## 2021-02-12 LAB — URINE CULTURE
MICRO NUMBER:: 12776956
SPECIMEN QUALITY:: ADEQUATE

## 2021-02-19 ENCOUNTER — Other Ambulatory Visit: Payer: Self-pay

## 2021-02-19 ENCOUNTER — Ambulatory Visit (INDEPENDENT_AMBULATORY_CARE_PROVIDER_SITE_OTHER): Payer: PRIVATE HEALTH INSURANCE

## 2021-02-19 DIAGNOSIS — R053 Chronic cough: Secondary | ICD-10-CM | POA: Diagnosis not present

## 2021-02-19 MED ORDER — IOHEXOL 300 MG/ML  SOLN
100.0000 mL | Freq: Once | INTRAMUSCULAR | Status: AC | PRN
  Administered 2021-02-19: 14:00:00 75 mL via INTRAVENOUS

## 2021-03-02 ENCOUNTER — Other Ambulatory Visit: Payer: Self-pay | Admitting: Sports Medicine

## 2021-03-02 DIAGNOSIS — F419 Anxiety disorder, unspecified: Secondary | ICD-10-CM

## 2021-03-02 DIAGNOSIS — F32A Depression, unspecified: Secondary | ICD-10-CM

## 2021-03-03 ENCOUNTER — Ambulatory Visit (INDEPENDENT_AMBULATORY_CARE_PROVIDER_SITE_OTHER): Payer: PRIVATE HEALTH INSURANCE | Admitting: Sports Medicine

## 2021-03-03 ENCOUNTER — Ambulatory Visit: Payer: PRIVATE HEALTH INSURANCE | Admitting: Sports Medicine

## 2021-03-03 ENCOUNTER — Other Ambulatory Visit: Payer: Self-pay

## 2021-03-03 VITALS — BP 125/89 | HR 91 | Temp 97.9°F | Ht 61.0 in | Wt 262.0 lb

## 2021-03-03 DIAGNOSIS — F419 Anxiety disorder, unspecified: Secondary | ICD-10-CM

## 2021-03-03 DIAGNOSIS — F32A Depression, unspecified: Secondary | ICD-10-CM | POA: Diagnosis not present

## 2021-03-03 DIAGNOSIS — E8881 Metabolic syndrome: Secondary | ICD-10-CM | POA: Insufficient documentation

## 2021-03-03 DIAGNOSIS — R053 Chronic cough: Secondary | ICD-10-CM | POA: Diagnosis not present

## 2021-03-03 MED ORDER — SERTRALINE HCL 50 MG PO TABS: 50.0000 mg | ORAL_TABLET | Freq: Every day | ORAL | 3 refills | Status: DC

## 2021-03-03 MED ORDER — TIRZEPATIDE 2.5 MG/0.5ML ~~LOC~~ SOAJ: 2.5000 mg | SUBCUTANEOUS | 0 refills | Status: DC

## 2021-03-03 NOTE — Progress Notes (Addendum)
° ° °  Procedures performed today:    None.  Independent interpretation of notes and tests performed by another provider:   None.  Brief History, Exam, Impression, and Recommendations:    Chronic cough with restrictive picture on PFTs Beverly Jackson returns, she is a pleasant 56 year old female, she has a history of breast cancer with radiation, she had a chronic cough, at the last visit for over 2 months with an unremarkable chest x-ray with the exception of atelectasis. We obtained a CT chest with contrast that showed some simple scarring. Today we did pre and postbronchodilator PFTs, these show an FEV1 of 58% predicted, FVC 53% predicted, FEV1 over FVC 108% predicted indicating more of a restrictive picture rather than obstructive. Sleep apnea and body habitus are the likely causes here. She will use her inhaler as needed if this helps we can use a longer acting form of this though I think ultimately continued treatment with CPAP and aggressive weight loss are going to be the key here.  Metabolic syndrome Prediabetic with a hemoglobin A1c of 6%, we will try Mounjaro, if unable to get this approved we can try Trulicity, Ozempic, until we find a GLP-1 that is covered. She needs to lose significant weight to help the restrictive lung picture.  Per patient weight loss treatments are a plan exclusion, because she is prediabetic, has metabolic syndrome we will try Trulicity, if this fails we will try Ozempic.    ___________________________________________ [name]. [name], M.D., ABFM., CAQSM. Primary Care and [place] Instructor of [place] of [facility] of Medicine

## 2021-03-03 NOTE — Assessment & Plan Note (Addendum)
Prediabetic with a hemoglobin A1c of 6%, we will try Mounjaro, if unable to get this approved we can try Trulicity, Ozempic, until we find a GLP-1 that is covered. She needs to lose significant weight to help the restrictive lung picture.  Per patient weight loss treatments are a plan exclusion, because she is prediabetic, has metabolic syndrome we will try Trulicity, if this fails we will try Ozempic.

## 2021-03-03 NOTE — Progress Notes (Signed)
Pt presents today for spirometry testing Test indication: chronic cough  Pt instructed on how to perform the spirometry testing procedure.   Pre-Testing: 4 attempts to reach good quality test  Pt instructed on how to use albuterol inhaler. Pt self-administered 2 inhalations successfully. Waited 10 minutes prior to post-testing  Post-Testing: 3 attempts to reach good quality testing  Report printed and handed to Dr. Darene Lamer for further evaluation.

## 2021-03-03 NOTE — Assessment & Plan Note (Signed)
Beverly Jackson returns, she is a pleasant 56 year old female, she has a history of breast cancer with radiation, she had a chronic cough, at the last visit for over 2 months with an unremarkable chest x-ray with the exception of atelectasis. We obtained a CT chest with contrast that showed some simple scarring. Today we did pre and postbronchodilator PFTs, these show an FEV1 of 58% predicted, FVC 53% predicted, FEV1 over FVC 108% predicted indicating more of a restrictive picture rather than obstructive. Sleep apnea and body habitus are the likely causes here. She will use her inhaler as needed if this helps we can use a longer acting form of this though I think ultimately continued treatment with CPAP and aggressive weight loss are going to be the key here.

## 2021-03-03 NOTE — Addendum Note (Signed)
Addended by: Silverio Decamp on: 03/03/2021 03:10 PM   Modules accepted: Orders, Level of Service

## 2021-03-14 ENCOUNTER — Other Ambulatory Visit: Payer: Self-pay | Admitting: Sports Medicine

## 2021-03-14 DIAGNOSIS — G4486 Cervicogenic headache: Secondary | ICD-10-CM

## 2021-03-17 MED ORDER — TRULICITY 0.75 MG/0.5ML ~~LOC~~ SOAJ: 0.7500 mg | SUBCUTANEOUS | 11 refills | Status: DC

## 2021-03-17 NOTE — Addendum Note (Signed)
Addended by: Silverio Decamp on: 03/17/2021 02:55 PM   Modules accepted: Orders

## 2021-03-24 ENCOUNTER — Other Ambulatory Visit: Payer: Self-pay | Admitting: Sports Medicine

## 2021-03-24 DIAGNOSIS — M170 Bilateral primary osteoarthritis of knee: Secondary | ICD-10-CM

## 2021-03-30 IMAGING — DX RIGHT KNEE - COMPLETE 4+ VIEW
3 series · 3 of 3 positions shown · non-contrast
Comparison: None.

CLINICAL DATA: Right knee pain

EXAM:
RIGHT KNEE - COMPLETE 4+ VIEW

[knee ap]
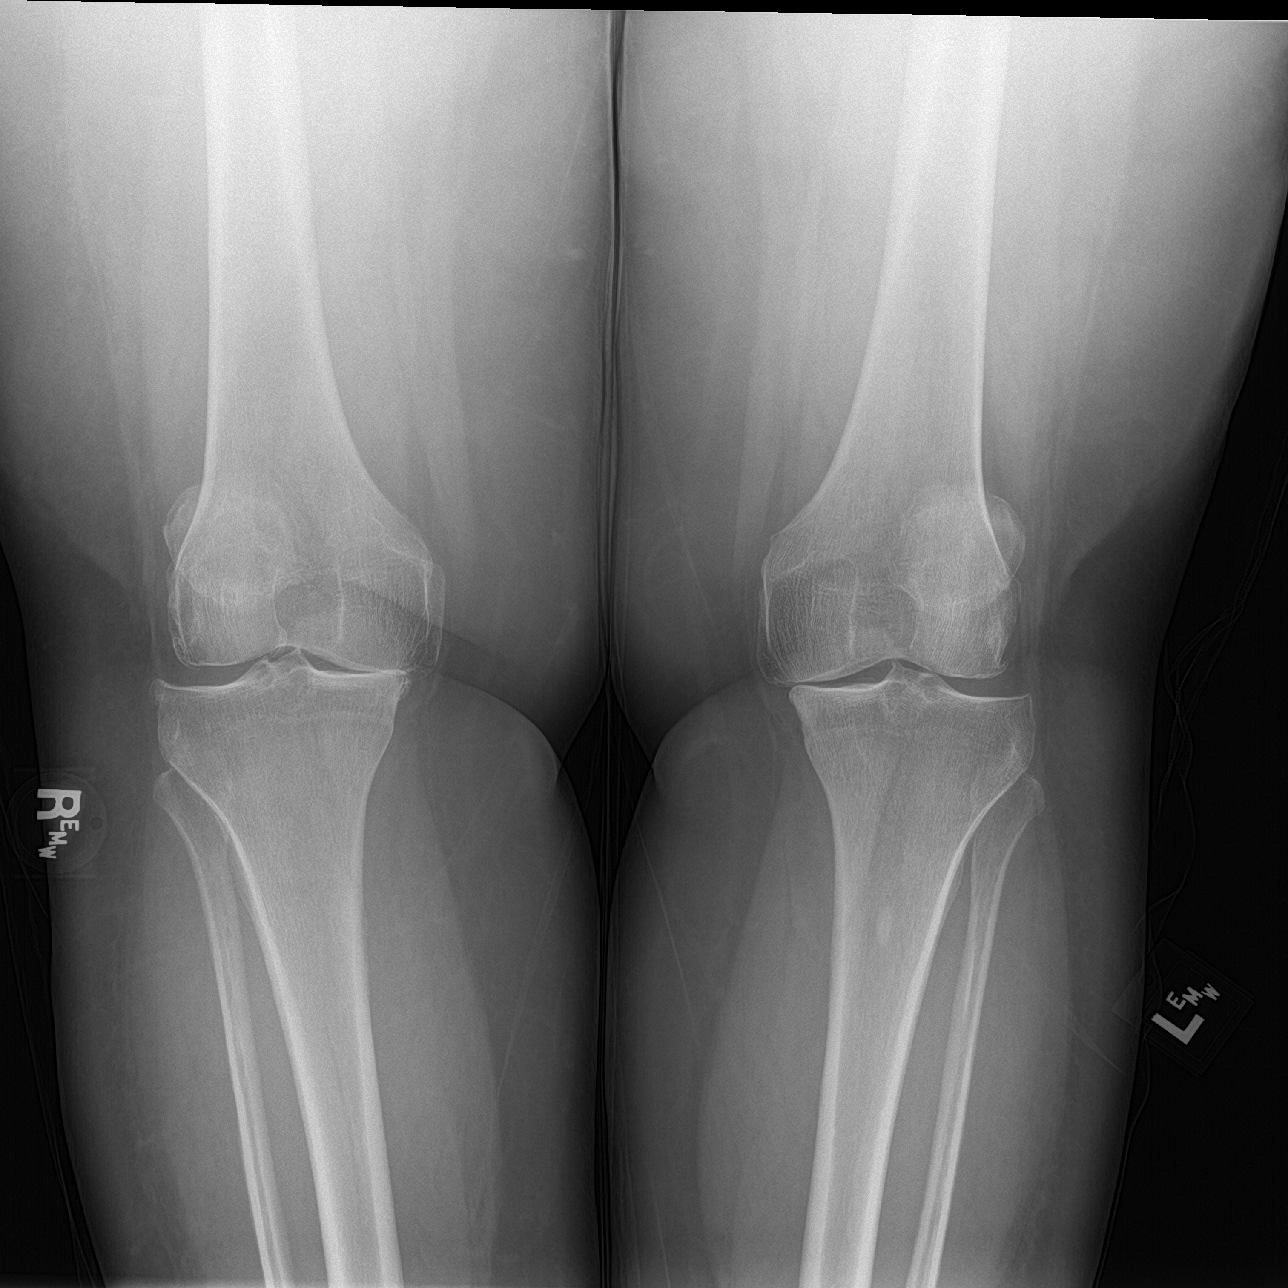

[tunnel]
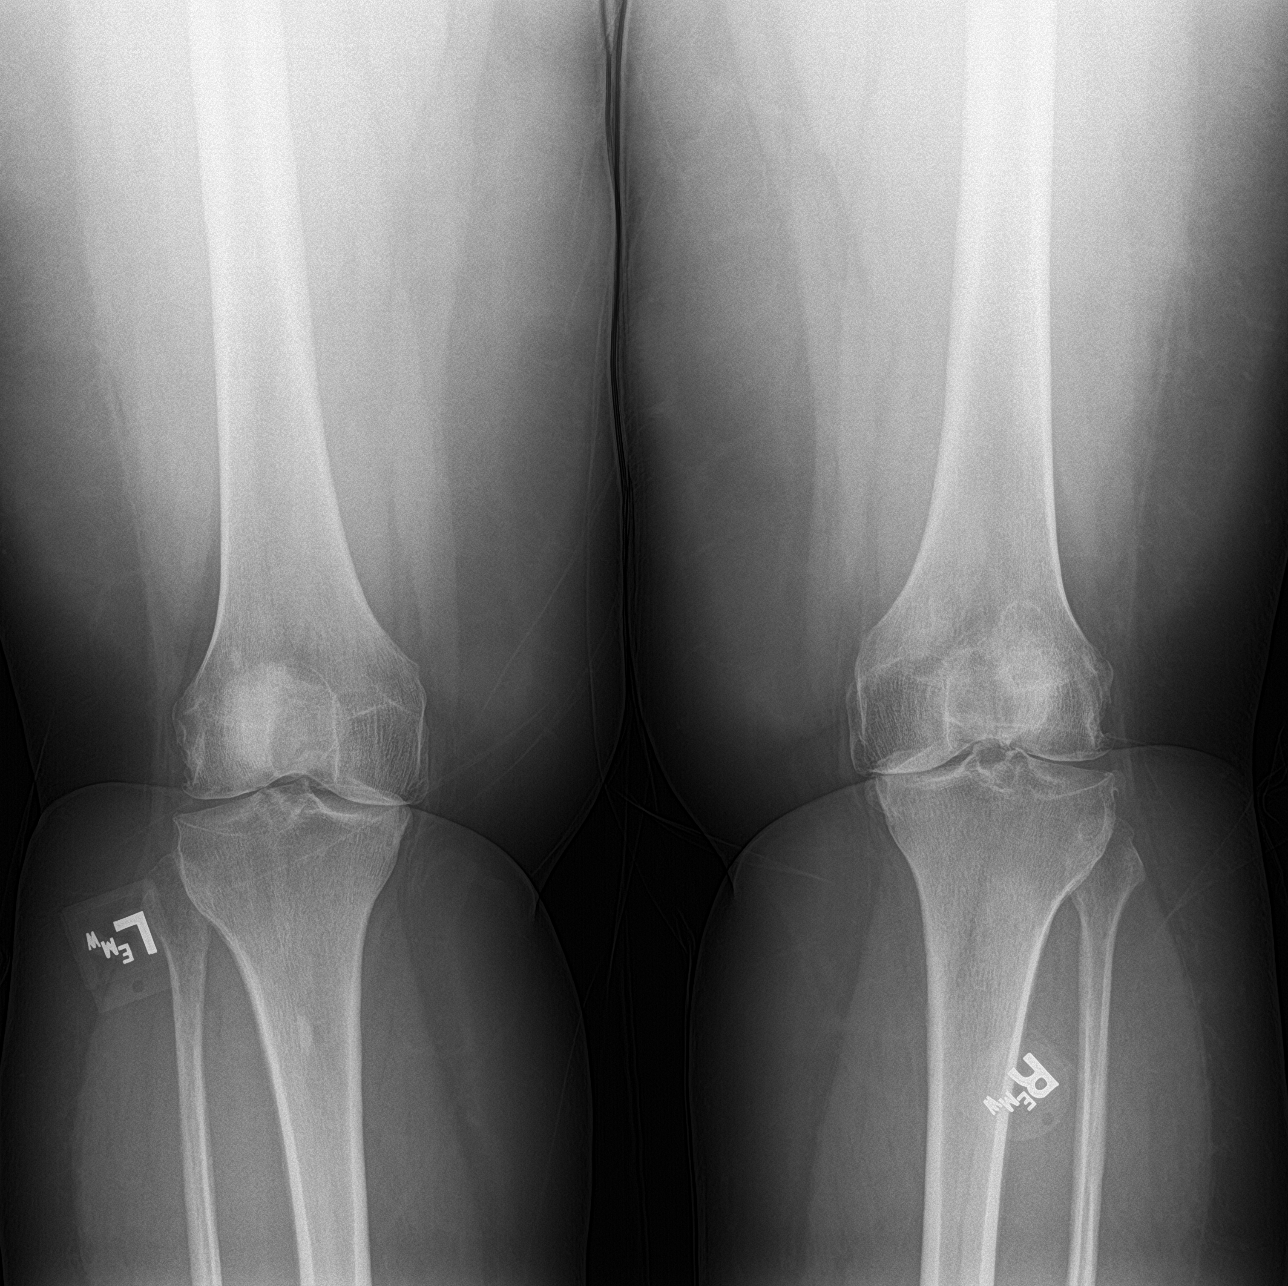

[knee lat]
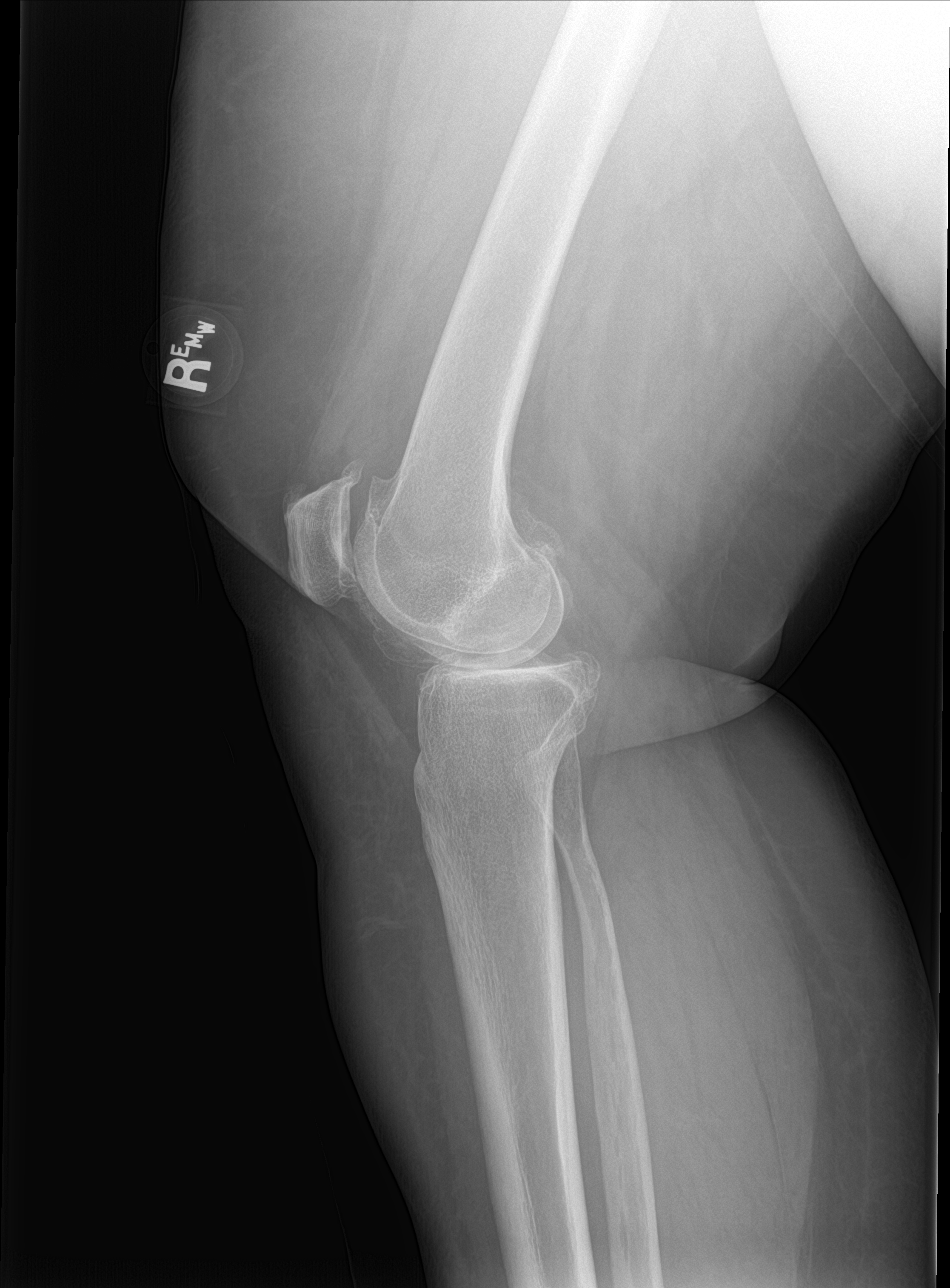

[3 of 3 positions shown; findings below may reference images not displayed]

FINDINGS: Tricompartmental degenerative changes are noted worst in the medial
joint space. No joint effusion is seen. No acute fracture or
dislocation is noted.
IMPRESSION: Tricompartmental degenerative changes.

## 2021-03-30 IMAGING — DX RIGHT ANKLE - COMPLETE 3+ VIEW
3 series · 3 of 3 positions shown · non-contrast
Comparison: None.

CLINICAL DATA: Lateral ankle pain for several months, initial
encounter

EXAM:
RIGHT ANKLE - COMPLETE 3+ VIEW

[ankle ap]
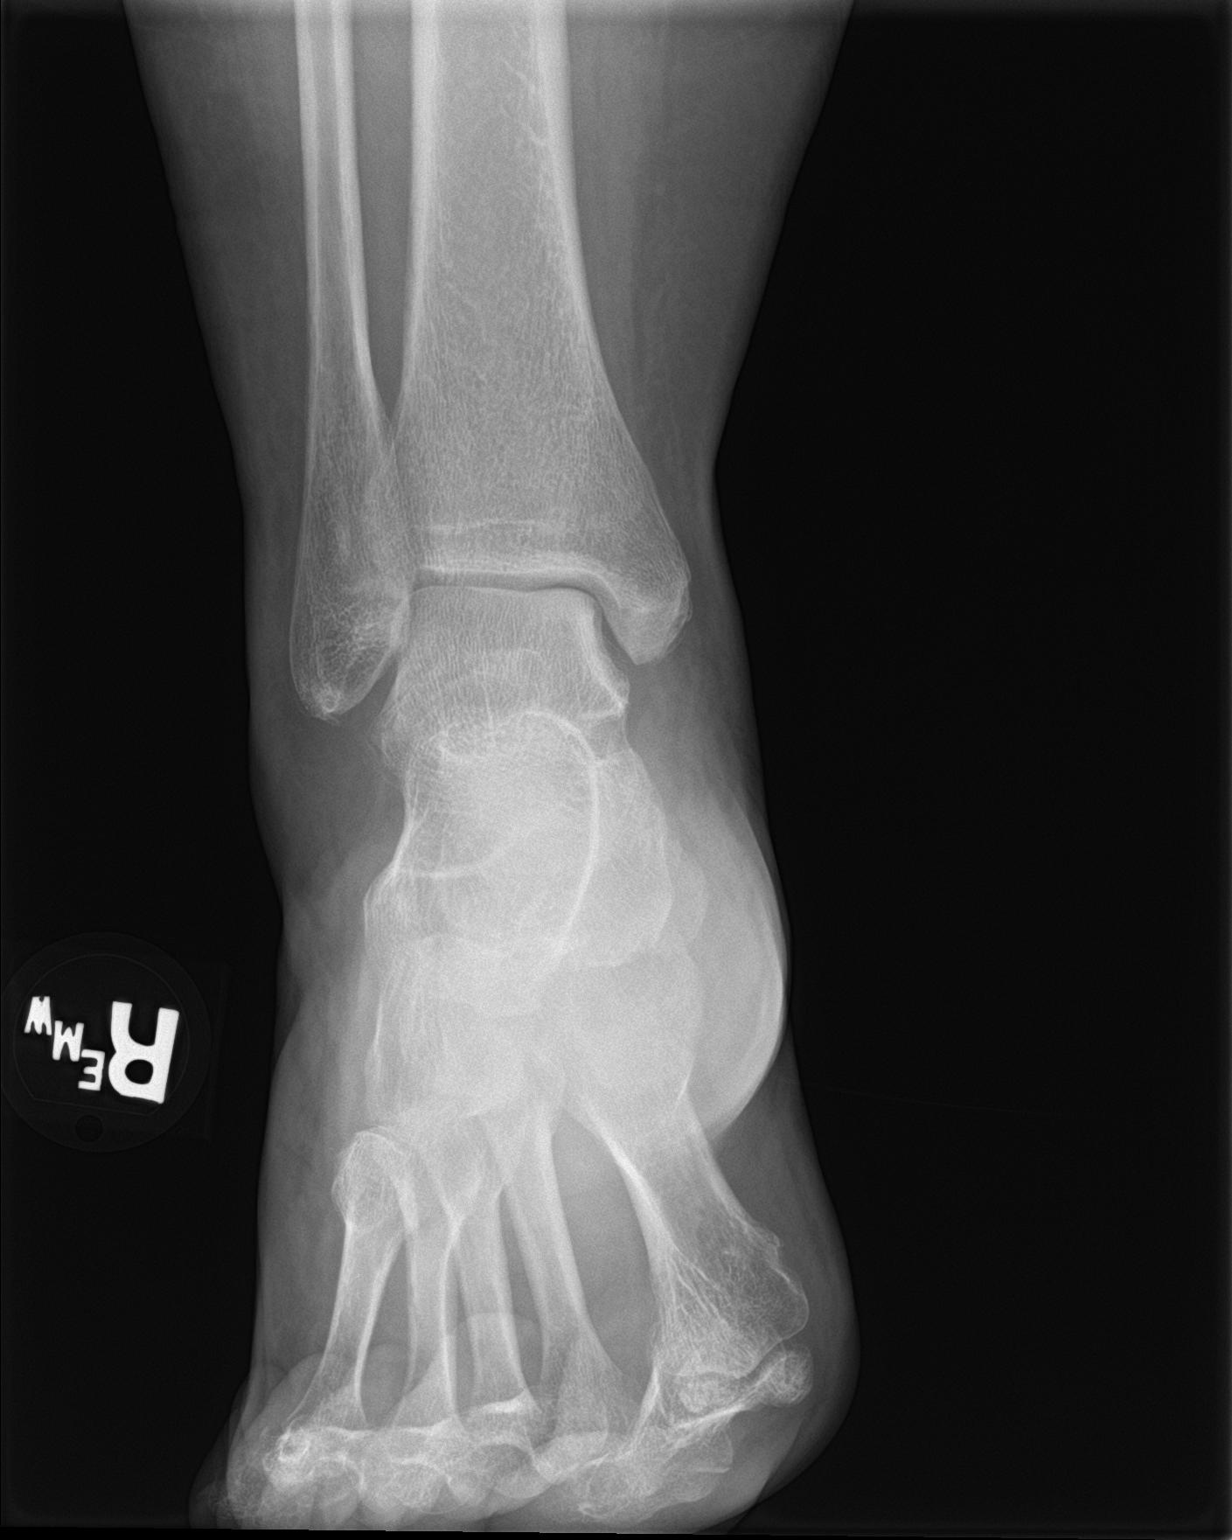

[ankle obl]
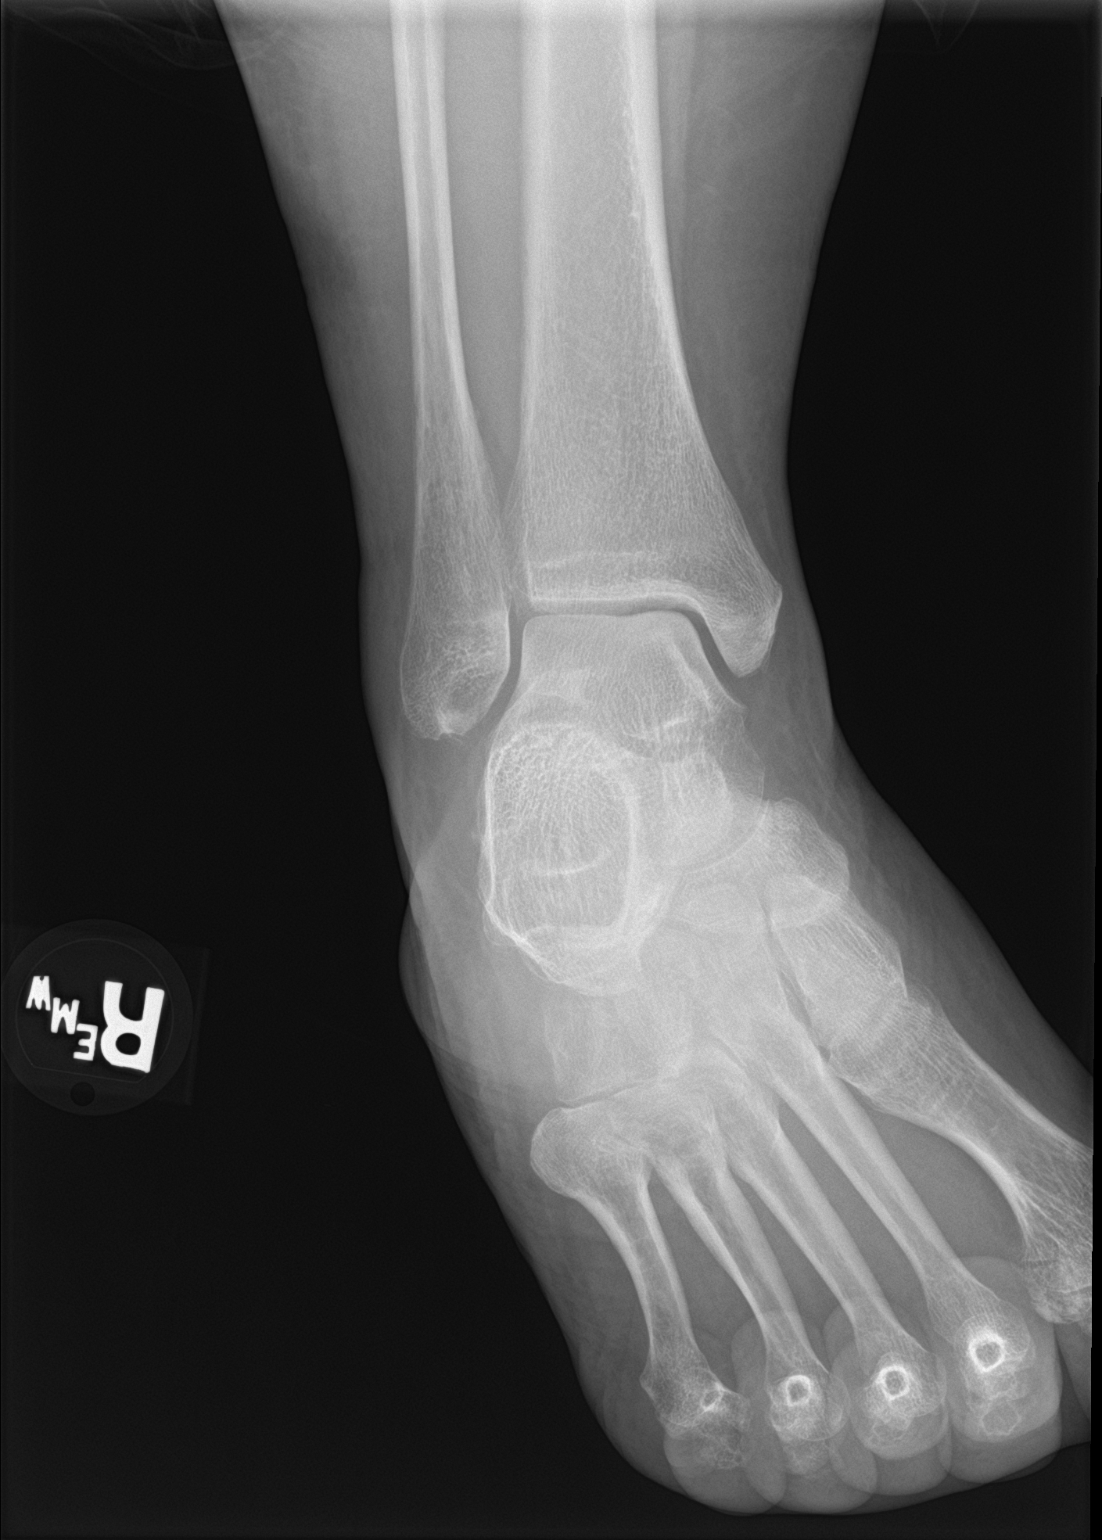

[ankle lat]
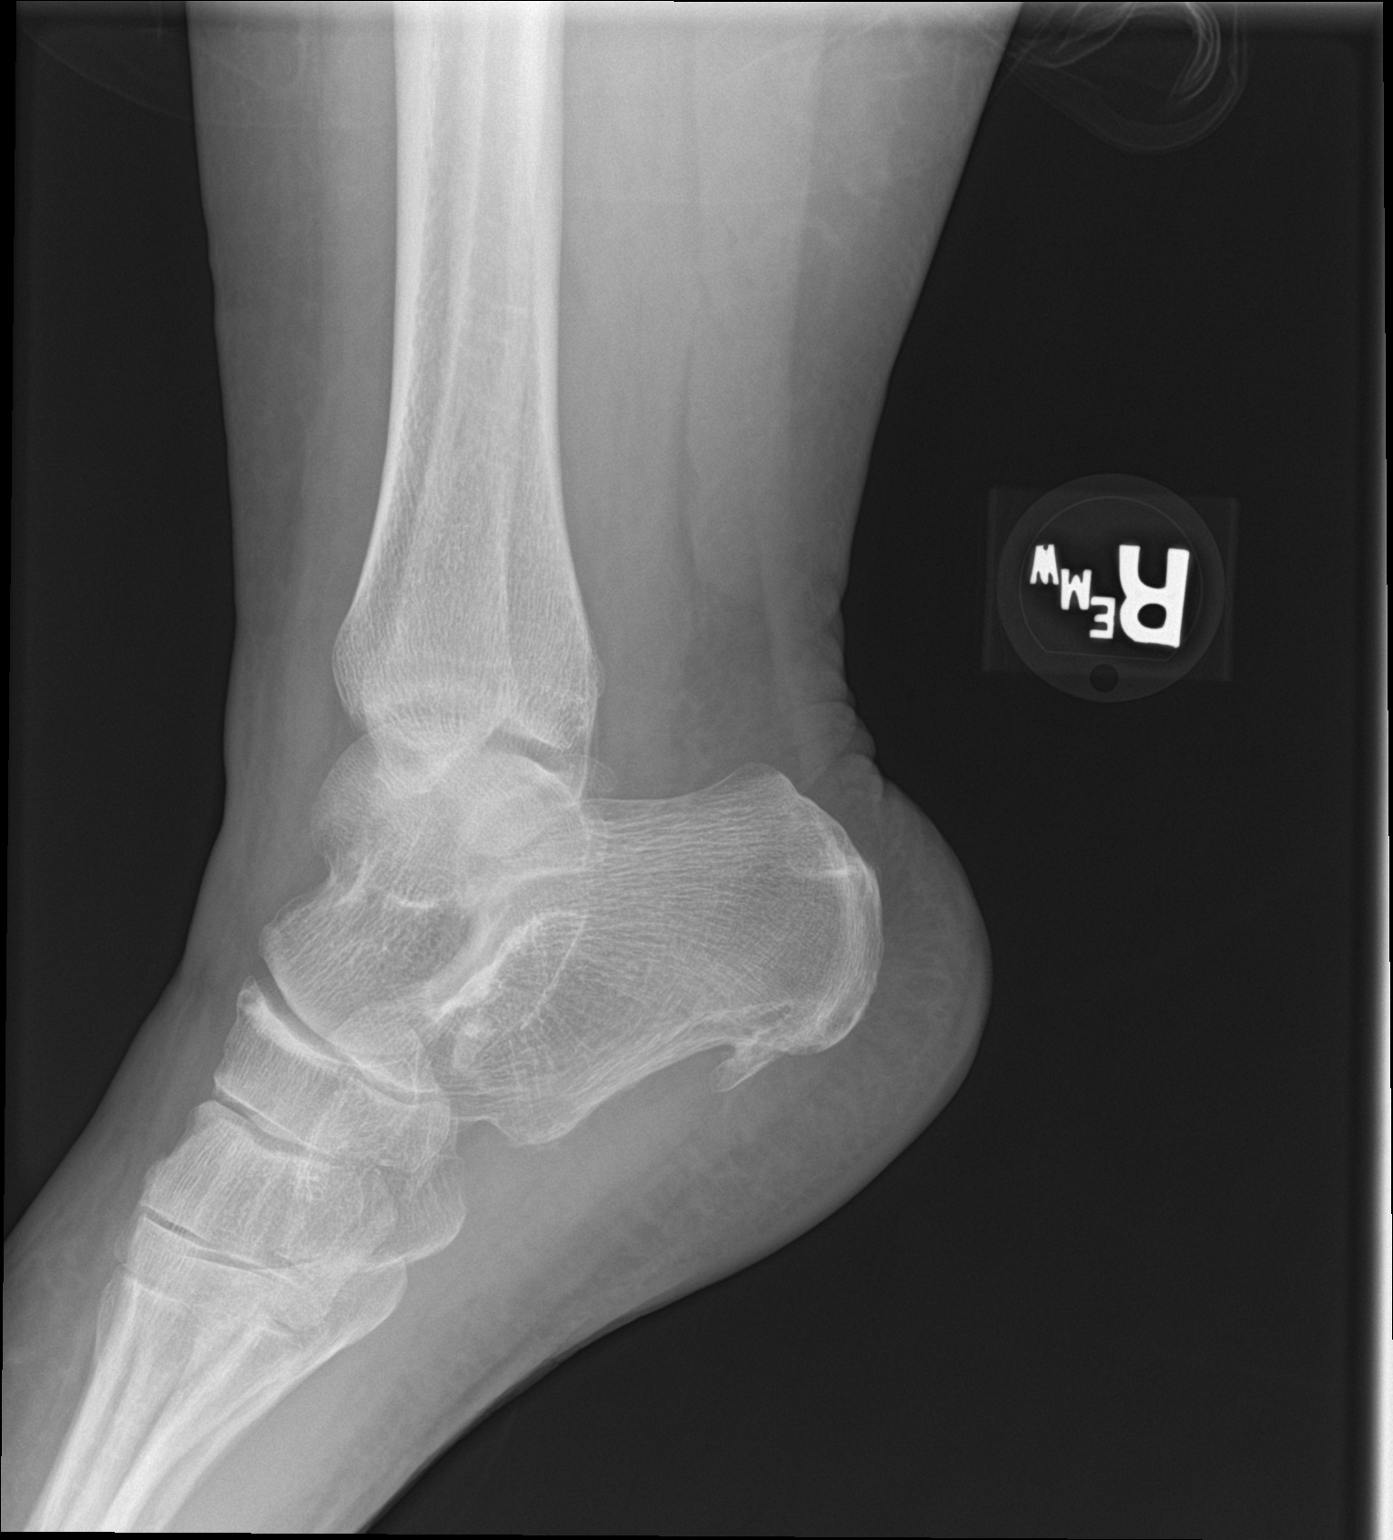

[3 of 3 positions shown; findings below may reference images not displayed]

FINDINGS: Mild generalized soft tissue swelling is noted. No acute fracture or
dislocation is seen. Calcaneal spurring and tarsal degenerative
changes are noted.
IMPRESSION: Chronic changes without acute abnormality.

## 2021-06-13 ENCOUNTER — Other Ambulatory Visit: Payer: Self-pay | Admitting: Sports Medicine

## 2021-06-13 DIAGNOSIS — G4486 Cervicogenic headache: Secondary | ICD-10-CM

## 2021-06-20 ENCOUNTER — Other Ambulatory Visit: Payer: Self-pay | Admitting: Sports Medicine

## 2021-06-20 DIAGNOSIS — M170 Bilateral primary osteoarthritis of knee: Secondary | ICD-10-CM

## 2021-06-22 ENCOUNTER — Other Ambulatory Visit: Payer: Self-pay | Admitting: Sports Medicine

## 2021-08-04 ENCOUNTER — Emergency Department (INDEPENDENT_AMBULATORY_CARE_PROVIDER_SITE_OTHER): Payer: PRIVATE HEALTH INSURANCE

## 2021-08-04 ENCOUNTER — Emergency Department (INDEPENDENT_AMBULATORY_CARE_PROVIDER_SITE_OTHER)
Admission: EM | Admit: 2021-08-04 | Discharge: 2021-08-04 | Disposition: A | Discharge status: Home / Self Care | Payer: PRIVATE HEALTH INSURANCE | Source: Home / Self Care

## 2021-08-04 DIAGNOSIS — W228XXA Striking against or struck by other objects, initial encounter: Secondary | ICD-10-CM | POA: Diagnosis not present

## 2021-08-04 DIAGNOSIS — S96909A Unspecified injury of unspecified muscle and tendon at ankle and foot level, unspecified foot, initial encounter: Secondary | ICD-10-CM

## 2021-08-04 DIAGNOSIS — M7989 Other specified soft tissue disorders: Secondary | ICD-10-CM | POA: Diagnosis not present

## 2021-08-04 DIAGNOSIS — S9032XA Contusion of left foot, initial encounter: Secondary | ICD-10-CM

## 2021-08-04 DIAGNOSIS — M79672 Pain in left foot: Secondary | ICD-10-CM | POA: Diagnosis not present

## 2021-08-04 MED ORDER — ACETAMINOPHEN 325 MG PO TABS
650.0000 mg | ORAL_TABLET | Freq: Once | ORAL | Status: AC
  Administered 2021-08-04: 650 mg via ORAL

## 2021-08-04 MED ORDER — ACETAMINOPHEN-CODEINE 300-60 MG PO TABS: 1.0000 | ORAL_TABLET | Freq: Four times a day (QID) | ORAL | 0 refills | Status: DC | PRN

## 2021-08-04 NOTE — ED Triage Notes (Signed)
Pt c/o pain of LT 4th and 5th toe pain after stubbing them on a suitcase in bedroom this morning. Pain 6/10, 10/10 when walking. Iced this am.

## 2021-08-04 NOTE — Discharge Instructions (Addendum)
Your xray does not show a fracture. Given your exam and bruising, I suspect a partial or complete tear of the tendon of your pinky toe. Continue celebrex, the other NSAIDs are contraindicated due to your zoloft. Take the Tylenol #4 only as needed for more severe pain. It may make you drowsy. Ice your toe. Elevate your foot. Wear the protective shoe and buddy tape your toes. Call ortho to schedule a follow up evaluation.

## 2021-08-04 NOTE — ED Provider Notes (Signed)
[facility]    CSN: [id number] Arrival date & time: 08/04/21  1338      History   Chief Complaint Chief Complaint  Patient presents with   Foot Injury    LT 4th & 5th toe    HPI Beverly Jackson is a 56 y.o. female.   56yo female presents today due to an injury to the 4th and 5th digits of her L foot occurring this morning. She was running into her bedroom to use the bathroom and very forcefully kicked a Chief Technology Officer on their floor. She states instant pain. She went out to lunch with her husband and felt that the pain had gotten so severe she couldn't walk. She is currently at a 6/10 pain primarily in her 5th digit. She has no pain in the foot itself at present, only the toes. The pain does radiate into the foot with flexion and extension of the ankle. She does take daily celebrex. Takes aldactone and lasix for chronic swelling, feels it is worse.   Foot Injury   Past Medical History:  Diagnosis Date   Anxiety    Cancer ([place])    Chest pain    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   Edema, lower extremity    Gallbladder disease    Hypertension    Joint pain    Low blood sugar    MVP (mitral valve prolapse)    Obesity    Osteoarthritis    Sleep apnea    Thyroid disease    Vitamin D deficiency     Patient Active Problem List   Diagnosis Date Noted   Metabolic syndrome 70/26/3785   Chronic cough with restrictive picture on PFTs 02/10/2021   Urinary incontinence 02/10/2021   Dyspepsia 11/05/2020   Anxiety and depression 11/05/2020   History of migraine headaches 11/05/2020   Pancreatitis 08/12/2020   Obstructive sleep apnea 06/08/2019   Prediabetes 03/07/2019   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   History of nephrolithiasis 09/27/2018   History of hysterectomy for benign disease 09/27/2018   Edema of left lower extremity 09/27/2018   History of breast cancer 08/23/2018   Annual physical exam 08/23/2018   Primary  osteoarthritis of both knees 08/23/2018   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 Authorizing Provider  acetaminophen-codeine (TYLENOL #4) 300-60 MG tablet Take 1 tablet by mouth every 6 (six) hours as needed for moderate pain or severe pain. 08/04/21  Yes Timmey Lamba L, PA  blood glucose meter kit and supplies KIT Dispense based on patient and insurance preference. Use  up to two times daily as directed. (FOR ICD-9 250.00, 250.01). 03/27/19   Briscoe Deutscher, DO  celecoxib (CELEBREX) 200 MG capsule TAKE 1 TO 2 CAPSULES BY MOUTH EVERY DAY AS NEEDED FOR PAIN 06/20/21   Silverio Decamp, MD  Dulaglutide (TRULICITY) 0.86 PY/1.9JK SOPN Inject 0.75 mg into the skin once a week. 03/17/21   Silverio Decamp, MD  furosemide (LASIX) 20 MG tablet TAKE ONE TABLET BY MOUTH DAILY 02/07/21   Silverio Decamp, MD  glucose blood (ACCU-CHEK AVIVA PLUS) test strip Twice daily 03/27/19   Briscoe Deutscher, DO  Lancets (ACCU-CHEK SAFE-T PRO) lancets Twice daily 03/27/19   Briscoe Deutscher, DO  levothyroxine (SYNTHROID) 50 MCG tablet TAKE ONE TABLET BY MOUTH DAILY 06/24/21   Silverio Decamp, MD  nitrofurantoin, macrocrystal-monohydrate, (MACROBID) 100 MG capsule Take 1 capsule (100 mg total) by mouth 2 (two) times daily. 02/10/21   Silverio Decamp, MD  rizatriptan (MAXALT-MLT) 5 MG disintegrating tablet Take 1 tablet (5 mg total) by  mouth as needed for migraine. May repeat in 2 hours if needed 11/05/20   Silverio Decamp, MD  sertraline (ZOLOFT) 50 MG tablet Take 1 tablet (50 mg total) by mouth daily. 03/03/21   Silverio Decamp, MD  spironolactone (ALDACTONE) 50 MG tablet TAKE ONE TABLET BY MOUTH DAILY 02/07/21   Silverio Decamp, MD  tamoxifen (NOLVADEX) 20 MG tablet Take 1 tablet by mouth daily. 12/26/20   [provider]  topiramate (TOPAMAX) 50 MG tablet TAKE ONE TABLET BY MOUTH DAILY 06/13/21   Silverio Decamp, MD    Family History Family History  Problem Relation Age of Onset   Hypertension Mother    Depression Mother    Atrial fibrillation Mother    Thyroid disease Mother    Hyperlipidemia Mother    Stroke Mother    Cancer Mother    Obesity Mother    Cancer Father     Social History Social History   Tobacco Use   Smoking status: Never   Smokeless tobacco: Never  Substance Use Topics   Alcohol use: Never   Drug use: Never     Allergies   Mixed ragweed, Penicillins, and Short ragweed pollen ext   Review of Systems Review of Systems As per hpi  Physical Exam Triage Vital Signs ED Triage Vitals  Enc Vitals Group     BP 08/04/21 1355 134/83     Pulse Rate 08/04/21 1355 82     Resp 08/04/21 1355 18     Temp 08/04/21 1355 98.4 F (36.9 C)     Temp Source 08/04/21 1355 Oral     SpO2 08/04/21 1355 96 %     Weight --      Height --      Head Circumference --      Peak Flow --      Pain Score 08/04/21 1353 6     Pain Loc --      Pain Edu? --      Excl. in [place]? --    No data found.  Updated Vital Signs BP 134/83 (BP Location: Right Arm)   Pulse 82   Temp 98.4 F (36.9 C) (Oral)   Resp 18   SpO2 96%   Visual Acuity Right Eye Distance:   Left Eye Distance:   Bilateral Distance:    Right Eye Near:   Left Eye Near:    Bilateral Near:     Physical Exam Vitals and nursing note  reviewed.  Constitutional:      Appearance: Normal appearance. She is  obese. She is not ill-appearing or toxic-appearing.  HENT:     Head: Normocephalic and atraumatic.  Musculoskeletal:        General: Swelling (chronic swelling of foot, new onset swelling of 5th digit), tenderness (to entire 5th digit on L) and signs of injury (bruising noted) present. No deformity.     Right lower leg: Edema present.     Left lower leg: Edema present.     Comments: FROM of great toe through 4th toe on L. Inability to flex or extend 5th toe at IP joint  Skin:    General: Skin is warm and dry.     Capillary Refill: Capillary refill takes less than 2 seconds.     Findings: Bruising (laterally around the IP of 5th digit on L) present. No erythema.  Neurological:     General: No focal deficit present.     Mental Status: She is alert and oriented to person, place, and time.     Sensory: No sensory deficit.     Motor: No weakness.     Gait: Gait abnormal (favoring L foot).  Psychiatric:        Mood and Affect: Mood normal.      UC Treatments / Results  Labs (all labs ordered are listed, but only abnormal results are displayed) Labs Reviewed - No data to display  EKG   Radiology DG Foot Complete Left  Result Date: 08/04/2021 CLINICAL DATA:  Provided history: Injury. Additional history provided: Patient reports hitting left foot on suitcase this morning. Pain in third through fifth metatarsals. Overlying swelling. EXAM: LEFT FOOT - COMPLETE 3+ VIEW COMPARISON:  No pertinent prior exams available for comparison. FINDINGS: There is normal bony alignment. No evidence of acute osseous or articular abnormality. The joint spaces are maintained. Enthesophyte formation at the Achilles tendon insertion. Plantar calcaneal spurring. Soft tissue swelling about the foot. IMPRESSION: No evidence of acute osseous or articular abnormality. Soft tissue swelling about the foot. Electronically Signed   By: Kellie Simmering D.O.   On: 08/04/2021 14:18    Procedures Procedures (including  critical care time)  Medications Ordered in UC Medications  acetaminophen (TYLENOL) tablet 650 mg (650 mg Oral Given 08/04/21 1504)    Initial Impression / Assessment and Plan / UC Course  I have reviewed the triage vital signs and the nursing notes.  Pertinent labs & imaging results that were available during my care of the patient were reviewed by me and considered in my medical decision making (see chart for details).     Contusion L foot - xray shows no bony trauma. Discussed with pt to rule out occult fracture, xrays can be repeated in 4-5 days or CT can be performed. Will place pt in post op shoe. Injury to tendon of toe - buddy taped toes together. Pt already on celebrex and cannot take etodolac or diclofenac due to zoloft, contraindicated. Ice. Elevate foot. Will add Tylenol #4 for more severe pain. F/U with ortho if more extensive workup is desired.  Final Clinical Impressions(s) / UC Diagnoses   Final diagnoses:  Injury of tendon of toe  Contusion of left foot, initial encounter     Discharge Instructions      Your xray does not show a fracture. Given your exam and bruising, I suspect a partial or complete tear of the tendon of your pinky toe. Continue celebrex, the other NSAIDs are contraindicated  due to your zoloft. Take the Tylenol #4 only as needed for more severe pain. It may make you drowsy. Ice your toe. Elevate your foot. Wear the protective shoe and buddy tape your toes. Call ortho to schedule a follow up evaluation.    ED Prescriptions     Medication Sig Dispense Auth. Provider   acetaminophen-codeine (TYLENOL #4) 300-60 MG tablet Take 1 tablet by mouth every 6 (six) hours as needed for moderate pain or severe pain. 12 tablet Adair Lauderback L, [place]      I have reviewed the PDMP during this encounter.   Chaney Malling, [place] 08/04/21 1529

## 2021-09-13 ENCOUNTER — Other Ambulatory Visit: Payer: Self-pay | Admitting: Sports Medicine

## 2021-09-13 DIAGNOSIS — G4486 Cervicogenic headache: Secondary | ICD-10-CM

## 2021-09-27 ENCOUNTER — Other Ambulatory Visit: Payer: Self-pay | Admitting: Sports Medicine

## 2021-09-27 DIAGNOSIS — M170 Bilateral primary osteoarthritis of knee: Secondary | ICD-10-CM

## 2021-10-01 ENCOUNTER — Encounter (INDEPENDENT_AMBULATORY_CARE_PROVIDER_SITE_OTHER): Payer: Self-pay

## 2021-10-14 ENCOUNTER — Encounter: Payer: Self-pay | Admitting: Family Medicine

## 2021-10-14 ENCOUNTER — Ambulatory Visit (INDEPENDENT_AMBULATORY_CARE_PROVIDER_SITE_OTHER): Payer: PRIVATE HEALTH INSURANCE | Admitting: Family Medicine

## 2021-10-14 VITALS — BP 131/82 | HR 83 | Ht 61.0 in | Wt 269.0 lb

## 2021-10-14 DIAGNOSIS — R5383 Other fatigue: Secondary | ICD-10-CM | POA: Diagnosis not present

## 2021-10-14 DIAGNOSIS — G4483 Primary cough headache: Secondary | ICD-10-CM

## 2021-10-14 DIAGNOSIS — Z1211 Encounter for screening for malignant neoplasm of colon: Secondary | ICD-10-CM | POA: Diagnosis not present

## 2021-10-14 DIAGNOSIS — L678 Other hair color and hair shaft abnormalities: Secondary | ICD-10-CM

## 2021-10-14 LAB — POCT INFLUENZA A/B
Influenza A, POC: NEGATIVE
Influenza B, POC: NEGATIVE

## 2021-10-14 NOTE — Assessment & Plan Note (Signed)
-   in association with weight gain and fatigue will order TSH to evaluate thyroid function. Last TSH done in Nov 2022 was normal but with new onset of symptoms would like to rule this out.

## 2021-10-14 NOTE — Progress Notes (Signed)
Acute Office Visit  Subjective:     Patient ID: Beverly Jackson, female    DOB: 04-Mar-1965, 56 y.o.   MRN: [id number]  Chief Complaint  Patient presents with   Headache   Dizziness   Fatigue    Headache  Associated symptoms include dizziness. Pertinent negatives include no coughing, fever, nausea or vomiting.  Dizziness Associated symptoms include headaches. Pertinent negatives include no chest pain, chills, coughing, fever, nausea or vomiting.   Patient is in today for headaches, dizziness, and fatigue. She has been out of work since last Tuesday. Fatigue has been the most severe symptom and she has been unable to perform her duties at work. Fatigue is described as tiring out easily and having trouble getting things started. Headaches located at the top of her head. Admits to dizziness/ lightheadedness that was enough to make her stop what she is doing and sit down. She has a hx of vertigo and knows this is different. No new medications changes. No body aches, chills, fevers. She has noticed brittle hair. Eating habits are skewed due to mom being in and out of the hospital. Her mother is in the hospital and had to have a colon resection. No gi symptoms.   Review of Systems  Constitutional:  Negative for chills and fever.  Respiratory:  Negative for cough and shortness of breath.   Cardiovascular:  Negative for chest pain.  Gastrointestinal:  Negative for diarrhea, nausea and vomiting.  Neurological:  Positive for dizziness and headaches.        Objective:    BP 131/82   Pulse 83   Ht '5\' 1"'$  (1.549 m)   Wt 269 lb (122 kg)   SpO2 96%   BMI 50.83 kg/m    Physical Exam Vitals and nursing note reviewed.  Constitutional:      General: She is not in acute distress.    Appearance: Normal appearance.  HENT:     Head: Normocephalic and atraumatic.     Comments: L TM normal with good cone of light. R TM has some ear wax unable to visualize all of TM    Right Ear: External ear  normal.     Left Ear: External ear normal.     Nose: Nose normal.  Eyes:     Conjunctiva/sclera: Conjunctivae normal.  Cardiovascular:     Rate and Rhythm: Normal rate and regular rhythm.     Heart sounds: Normal heart sounds.  Pulmonary:     Effort: Pulmonary effort is normal.     Breath sounds: Normal breath sounds.  Musculoskeletal:     Cervical back: Neck supple.  Lymphadenopathy:     Cervical: No cervical adenopathy.  Skin:    General: Skin is dry.  Neurological:     General: No focal deficit present.     Mental Status: She is alert and oriented to person, place, and time.  Psychiatric:        Mood and Affect: Mood normal. Mood is not anxious or depressed.        Speech: Speech normal.        Behavior: Behavior normal. Behavior is not agitated.        Thought Content: Thought content normal.        Cognition and Memory: Cognition is not impaired.        Judgment: Judgment normal.     No results found for any visits on 10/14/21.      Assessment & Plan:   Problem List  Items Addressed This Visit       Musculoskeletal and Integument   Brittle hair    - in association with weight gain and fatigue will order TSH to evaluate thyroid function. Last TSH done in Nov 2022 was normal but with new onset of symptoms would like to rule this out.       Relevant Orders   TSH + free T4     Other   Screening for colon cancer   Relevant Orders   Ambulatory referral to Gastroenterology   Fatigue - Primary    - fatigue non-specific. Vitals stable - pt has been visiting mother at the hospital so likely has sick contacts - will test for flu  - ordering CBC to evaluate for infection vs anemia, CMP to look for elyte abnormalities for fatigue  - has had vit d deficiency in the past but is compliant with her weekly vit d supplements so this is less likely the cause - has a hx of depression but says this fatigue is different than depression fatigue - also possible she is just over  tired and overwhelmed with her mom in the hospital and is not sleeping well or eating healthy to give her good energy.  - flu test interpreted by myself and was negative for flu A and B      Relevant Orders   CBC   COMPLETE METABOLIC PANEL WITH GFR    No orders of the defined types were placed in this encounter.   Return if symptoms worsen or fail to improve. Will also determine follow up based on lab work results.   Owens Loffler, DO

## 2021-10-14 NOTE — Assessment & Plan Note (Addendum)
-   fatigue non-specific. Vitals stable - pt has been visiting mother at the hospital so likely has sick contacts - will test for flu  - ordering CBC to evaluate for infection vs anemia, CMP to look for elyte abnormalities for fatigue  - has had vit d deficiency in the past but is compliant with her weekly vit d supplements so this is less likely the cause - has a hx of depression but says this fatigue is different than depression fatigue - also possible she is just over tired and overwhelmed with her mom in the hospital and is not sleeping well or eating healthy to give her good energy.  - flu test interpreted by myself and was negative for flu A and B

## 2021-10-18 LAB — COMPLETE METABOLIC PANEL WITH GFR
AG Ratio: 1.6 (calc) (ref 1.0–2.5)
ALT: 27 U/L (ref 6–29)
AST: 23 U/L (ref 10–35)
Albumin: 4.2 g/dL (ref 3.6–5.1)
Alkaline phosphatase (APISO): 99 U/L (ref 37–153)
BUN: 22 mg/dL (ref 7–25)
CO2: 23 mmol/L (ref 20–32)
Calcium: 9.8 mg/dL (ref 8.6–10.4)
Chloride: 106 mmol/L (ref 98–110)
Creat: 0.93 mg/dL (ref 0.50–1.03)
Globulin: 2.7 g/dL (calc) (ref 1.9–3.7)
Glucose, Bld: 146 mg/dL — ABNORMAL HIGH (ref 65–99)
Potassium: 4 mmol/L (ref 3.5–5.3)
Sodium: 141 mmol/L (ref 135–146)
Total Bilirubin: 0.3 mg/dL (ref 0.2–1.2)
Total Protein: 6.9 g/dL (ref 6.1–8.1)
eGFR: 73 mL/min/{1.73_m2} (ref >=60)

## 2021-10-18 LAB — CBC
HCT: 42.1 % (ref 35.0–45.0)
Hemoglobin: 14 g/dL (ref 11.7–15.5)
MCH: 29.2 pg (ref 27.0–33.0)
MCHC: 33.3 g/dL (ref 32.0–36.0)
MCV: 87.9 fL (ref 80.0–100.0)
MPV: 10.3 fL (ref 7.5–12.5)
Platelets: 214 10*3/uL (ref 140–400)
RBC: 4.79 10*6/uL (ref 3.80–5.10)
RDW: 12.3 % (ref 11.0–15.0)
WBC: 7 10*3/uL (ref 3.8–10.8)

## 2021-10-18 LAB — TSH+FREE T4: TSH W/REFLEX TO FT4: 3.24 mIU/L

## 2021-10-18 LAB — TEST AUTHORIZATION

## 2021-10-18 LAB — HEMOGLOBIN A1C W/OUT EAG: Hgb A1c MFr Bld: 5.9 % of total Hgb — ABNORMAL HIGH (ref <5.7)

## 2021-10-29 ENCOUNTER — Encounter: Payer: Self-pay | Admitting: Sports Medicine

## 2021-10-29 ENCOUNTER — Ambulatory Visit (INDEPENDENT_AMBULATORY_CARE_PROVIDER_SITE_OTHER): Payer: PRIVATE HEALTH INSURANCE | Admitting: Sports Medicine

## 2021-10-29 DIAGNOSIS — N951 Menopausal and female climacteric states: Secondary | ICD-10-CM | POA: Diagnosis not present

## 2021-10-29 DIAGNOSIS — F419 Anxiety disorder, unspecified: Secondary | ICD-10-CM | POA: Diagnosis not present

## 2021-10-29 DIAGNOSIS — M170 Bilateral primary osteoarthritis of knee: Secondary | ICD-10-CM

## 2021-10-29 DIAGNOSIS — Z6841 Body Mass Index (BMI) 40.0 and over, adult: Secondary | ICD-10-CM

## 2021-10-29 DIAGNOSIS — F32A Depression, unspecified: Secondary | ICD-10-CM

## 2021-10-29 MED ORDER — ONDANSETRON 8 MG PO TBDP: 8.0000 mg | ORAL_TABLET | Freq: Three times a day (TID) | ORAL | 3 refills | Status: DC | PRN

## 2021-10-29 MED ORDER — MELOXICAM 15 MG PO TABS: ORAL_TABLET | ORAL | 3 refills | Status: DC

## 2021-10-29 MED ORDER — SERTRALINE HCL 100 MG PO TABS: 100.0000 mg | ORAL_TABLET | Freq: Every day | ORAL | 11 refills | Status: DC

## 2021-10-29 MED ORDER — ALPRAZOLAM 0.5 MG PO TABS: 0.5000 mg | ORAL_TABLET | Freq: Two times a day (BID) | ORAL | 0 refills | Status: DC | PRN

## 2021-10-29 MED ORDER — SEMAGLUTIDE (2 MG/DOSE) 8 MG/3ML ~~LOC~~ SOPN: PEN_INJECTOR | SUBCUTANEOUS | 3 refills | Status: DC

## 2021-10-29 NOTE — Assessment & Plan Note (Signed)
Wegovy not covered, sending in compounded semaglutide. Adding Zofran as well.

## 2021-10-29 NOTE — Patient Instructions (Signed)
Look into Veozah for hot flashes.

## 2021-10-29 NOTE — Assessment & Plan Note (Signed)
Significant sweating, hot flashes, she is in surgical menopause, she is also on tamoxifen. We did discuss Veozah, she will look into it and let me know if she would like to try.

## 2021-10-29 NOTE — Assessment & Plan Note (Signed)
Did well after injections a while ago, she did have some dyspepsia once on meloxicam, would like to try this again as Celebrex is not as effective. I am seeing her sometime next week for additional knee injections.

## 2021-10-29 NOTE — Assessment & Plan Note (Signed)
Vaida returns, she is a very pleasant 56 year old female, she has had some losses and some scares in her family and has increasing anxiety, fatigue. She did see Dr. Mel Almond and had some labs done which were essentially normal, I am going to go and increase her Zoloft to 100 mg daily and add alprazolam 0.5 mg for use as needed. We should follow this up in about 6 weeks.

## 2021-10-29 NOTE — Progress Notes (Signed)
    Procedures performed today:    None.  Independent interpretation of notes and tests performed by another provider:   None.  Brief History, Exam, Impression, and Recommendations:    Anxiety and depression Beverly Jackson returns, she is a very pleasant 56 year old female, she has had some losses and some scares in her family and has increasing anxiety, fatigue. She did see Dr. Mel Almond and had some labs done which were essentially normal, I am going to go and increase her Zoloft to 100 mg daily and add alprazolam 0.5 mg for use as needed. We should follow this up in about 6 weeks.   Morbid obesity with BMI of 45.0-49.9, adult ([place]) Wegovy not covered, sending in compounded semaglutide. Adding Zofran as well.  Primary osteoarthritis of both knees Did well after injections a while ago, she did have some dyspepsia once on meloxicam, would like to try this again as Celebrex is not as effective. I am seeing her sometime next week for additional knee injections.  Vasomotor symptoms due to menopause Significant sweating, hot flashes, she is in surgical menopause, she is also on tamoxifen. We did discuss Veozah, she will look into it and let me know if she would like to try.    ____________________________________________ [name]. [name], M.D., ABFM., CAQSM., AME. Primary Care and Sports Medicine [facility]  Adjunct Professor of [place] [employer] [facility] of Medicine  Risk manager

## 2021-11-03 ENCOUNTER — Ambulatory Visit (INDEPENDENT_AMBULATORY_CARE_PROVIDER_SITE_OTHER): Payer: PRIVATE HEALTH INSURANCE | Admitting: Sports Medicine

## 2021-11-03 ENCOUNTER — Ambulatory Visit (INDEPENDENT_AMBULATORY_CARE_PROVIDER_SITE_OTHER): Payer: PRIVATE HEALTH INSURANCE

## 2021-11-03 DIAGNOSIS — M170 Bilateral primary osteoarthritis of knee: Secondary | ICD-10-CM | POA: Diagnosis not present

## 2021-11-03 NOTE — Patient Instructions (Signed)

## 2021-11-03 NOTE — Assessment & Plan Note (Signed)
Meloxicam ineffective, we injected both knees today, last done in 2021. Return to see me as needed.

## 2021-11-03 NOTE — Progress Notes (Signed)
    Procedures performed today:    Procedure: Real-time Ultrasound Guided injection of the left knee Device: Samsung HS60  Verbal informed consent obtained.  Time-out conducted.  Noted no overlying erythema, induration, or other signs of local infection.  Skin prepped in a sterile fashion.  Local anesthesia: Topical Ethyl chloride.  With sterile technique and under real time ultrasound guidance: Mild effusion noted, 1 cc Kenalog 40, 2 cc lidocaine, 2 cc bupivacaine injected easily Completed without difficulty  Advised to call if fevers/chills, erythema, induration, drainage, or persistent bleeding.  Images permanently stored and available for review in PACS.  Impression: Technically successful ultrasound guided injection.  Procedure: Real-time Ultrasound Guided injection of the right knee Device: Samsung HS60  Verbal informed consent obtained.  Time-out conducted.  Noted no overlying erythema, induration, or other signs of local infection.  Skin prepped in a sterile fashion.  Local anesthesia: Topical Ethyl chloride.  With sterile technique and under real time ultrasound guidance: Mild effusion noted, 1 cc Kenalog 40, 2 cc lidocaine, 2 cc bupivacaine injected easily Completed without difficulty  Advised to call if fevers/chills, erythema, induration, drainage, or persistent bleeding.  Images permanently stored and available for review in PACS.  Impression: Technically successful ultrasound guided injection.  Independent interpretation of notes and tests performed by another provider:   None.  Brief History, Exam, Impression, and Recommendations:    Primary osteoarthritis of both knees Meloxicam ineffective, we injected both knees today, last done in 2021. Return to see me as needed.  Chronic process with exacerbation and pharmacologic intervention  ____________________________________________ [name]. [name], M.D., ABFM., CAQSM., AME. Primary Care and Sports  Medicine [facility]  Adjunct Professor of [place] of [facility] of Medicine  Risk manager

## 2021-12-11 ENCOUNTER — Other Ambulatory Visit: Payer: Self-pay | Admitting: Sports Medicine

## 2021-12-11 DIAGNOSIS — G4486 Cervicogenic headache: Secondary | ICD-10-CM

## 2022-01-29 LAB — HM COLONOSCOPY

## 2022-03-07 ENCOUNTER — Other Ambulatory Visit: Payer: Self-pay | Admitting: Sports Medicine

## 2022-03-27 ENCOUNTER — Encounter: Payer: Self-pay | Admitting: Sports Medicine

## 2022-04-22 LAB — HM MAMMOGRAPHY

## 2022-04-27 ENCOUNTER — Encounter: Payer: Self-pay | Admitting: Emergency Medicine

## 2022-04-27 ENCOUNTER — Ambulatory Visit
Admission: EM | Admit: 2022-04-27 | Discharge: 2022-04-27 | Disposition: A | Discharge status: Home / Self Care | Payer: PRIVATE HEALTH INSURANCE

## 2022-04-27 ENCOUNTER — Ambulatory Visit (INDEPENDENT_AMBULATORY_CARE_PROVIDER_SITE_OTHER): Payer: PRIVATE HEALTH INSURANCE

## 2022-04-27 DIAGNOSIS — J069 Acute upper respiratory infection, unspecified: Secondary | ICD-10-CM

## 2022-04-27 DIAGNOSIS — R059 Cough, unspecified: Secondary | ICD-10-CM

## 2022-04-27 DIAGNOSIS — R042 Hemoptysis: Secondary | ICD-10-CM

## 2022-04-27 DIAGNOSIS — Z8701 Personal history of pneumonia (recurrent): Secondary | ICD-10-CM | POA: Diagnosis not present

## 2022-04-27 DIAGNOSIS — R079 Chest pain, unspecified: Secondary | ICD-10-CM | POA: Diagnosis not present

## 2022-04-27 LAB — POC SARS CORONAVIRUS 2 AG -  ED: SARS Coronavirus 2 Ag: NEGATIVE

## 2022-04-27 MED ORDER — FLUTICASONE PROPIONATE 50 MCG/ACT NA SUSP: 1.0000 | Freq: Two times a day (BID) | NASAL | 0 refills | Status: DC

## 2022-04-27 MED ORDER — LEVOCETIRIZINE DIHYDROCHLORIDE 5 MG PO TABS: 5.0000 mg | ORAL_TABLET | Freq: Every evening | ORAL | 0 refills | Status: DC

## 2022-04-27 MED ORDER — PROMETHAZINE-DM 6.25-15 MG/5ML PO SYRP: 5.0000 mL | ORAL_SOLUTION | Freq: Four times a day (QID) | ORAL | 0 refills | Status: DC | PRN

## 2022-04-27 NOTE — ED Provider Notes (Signed)
[facility]    CSN: [id number] Arrival date & time: 04/27/22  1242      History   Chief Complaint Chief Complaint  Patient presents with   Cough    HPI Beverly Jackson is a 57 y.o. female.   57 year old female presents today due to concern of a cough and "the crud" starting on Thursday of last week.  Reports significant nasal congestion and postnasal drip.  States she has been hacking up thick amounts of yellow.  Recently also noted some blood in her sputum.  Does have a history of pneumonia in the past.  Does not smoke.  Patient is uncertain if she has had fevers as she states she has been taking Tylenol around-the-clock.  Reports body aches and discomfort from her coughing. Mild sore throat. Has been taking OTC meds with no relief.    Cough   Past Medical History:  Diagnosis Date   Anxiety    Cancer ([place])    Chest pain    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   Edema, lower extremity    Gallbladder disease    Hypertension    Joint pain    Low blood sugar    MVP (mitral valve prolapse)    Obesity    Osteoarthritis    Sleep apnea    Thyroid disease    Vitamin D deficiency     Patient Active Problem List   Diagnosis Date Noted   Brittle hair 0000000   Metabolic syndrome [id number]   Chronic cough with restrictive picture on PFTs 02/10/2021   Urinary incontinence 02/10/2021   Dyspepsia 11/05/2020   Anxiety and depression 11/05/2020   History of migraine headaches 11/05/2020   Pancreatitis 08/12/2020   Obstructive sleep apnea 06/08/2019   Prediabetes 03/07/2019   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   History of nephrolithiasis 09/27/2018   History of hysterectomy for benign disease 09/27/2018   Edema of left lower extremity 09/27/2018   History of breast cancer 08/23/2018   Screening for colon cancer 08/23/2018   Primary osteoarthritis of both knees 08/23/2018   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 Authorizing Provider  fluticasone (FLONASE) 50 MCG/ACT nasal spray Place 1 spray into both nostrils in the morning and at bedtime. 04/27/22  Yes Cyler Kappes L, PA  levocetirizine (XYZAL) 5 MG tablet Take 1 tablet (5 mg total) by mouth every evening. 04/27/22  Yes Beryle Bagsby L, PA  promethazine-dextromethorphan (PROMETHAZINE-DM) 6.25-15 MG/5ML syrup Take 5 mLs by mouth 4 (four) times  daily as needed for cough. 04/27/22  Yes Saivon Prowse L, PA  Vitamin D, Ergocalciferol, (DRISDOL) 1.25 MG (50000 UNIT) CAPS capsule Take 1 capsule by mouth once a week. 03/02/22  Yes [provider]  ALPRAZolam Duanne Moron) 0.5 MG tablet Take 1 tablet (0.5 mg total) by mouth 2 (two) times daily as needed for anxiety. Patient not taking: Reported on 04/27/2022 10/29/21   Silverio Decamp, MD  furosemide (LASIX) 20 MG tablet TAKE ONE TABLET BY MOUTH DAILY 03/27/22   Silverio Decamp, MD  levothyroxine (SYNTHROID) 50 MCG tablet TAKE ONE TABLET BY MOUTH DAILY 06/24/21   Silverio Decamp, MD  meloxicam (MOBIC) 15 MG tablet One tab PO every 24 hours with a meal for 2 weeks, then once every 24 hours prn pain. 10/29/21   Silverio Decamp, MD  ondansetron (ZOFRAN-ODT) 8 MG disintegrating tablet Take 1 tablet (8 mg total) by mouth every 8 (eight) hours as needed for nausea. 10/29/21   Silverio Decamp, MD   sertraline (ZOLOFT) 100 MG tablet Take 1 tablet (100 mg total) by mouth daily. 10/29/21   Silverio Decamp, MD  spironolactone (ALDACTONE) 50 MG tablet TAKE ONE TABLET BY MOUTH DAILY 02/07/21   Silverio Decamp, MD  tamoxifen (NOLVADEX) 20 MG tablet Take 1 tablet by mouth daily. 12/26/20   [provider]    Family History Family History  Problem Relation Age of Onset   Hypertension Mother    Depression Mother    Atrial fibrillation Mother    Thyroid disease Mother    Hyperlipidemia Mother    Stroke Mother    Cancer Mother    Obesity Mother    Cancer Father     Social History Social History   Tobacco Use   Smoking status: Never   Smokeless tobacco: Never  Vaping Use   Vaping Use: Never used  Substance Use Topics   Alcohol use: Never   Drug use: Never     Allergies   Mixed ragweed, Penicillins, and Short ragweed pollen ext   Review of Systems Review of Systems  Respiratory:  Positive for cough.   As per HPI   Physical Exam Triage Vital Signs ED Triage Vitals  Enc Vitals Group     BP 04/27/22 1300 129/83     Pulse Rate 04/27/22 1300 83     Resp 04/27/22 1300 18     Temp 04/27/22 1300 98.6 F (37 C)     Temp Source 04/27/22 1300 Oral     SpO2 04/27/22 1300 98 %     Weight 04/27/22 1300 250 lb (113.4 kg)     Height 04/27/22 1300 '5\' 1"'$  (1.549 m)     Head Circumference --      Peak Flow --      Pain Score 04/27/22 1259 4     Pain Loc --      Pain Edu? --      Excl. in [place]? --    No data found.  Updated Vital Signs BP 129/83 (BP Location: Right Arm)   Pulse 83   Temp 98.6 F (37 C) (Oral)   Resp 18   Ht '5\' 1"'$  (1.549 m)   Wt 250 lb (113.4 kg)   SpO2 98%   BMI 47.24 kg/m   Visual Acuity Right Eye Distance:   Left Eye Distance:   Bilateral Distance:    Right Eye Near:   Left Eye Near:    Bilateral Near:  Physical Exam Vitals and nursing note reviewed.  Constitutional:      General: She is not in acute distress.     Appearance: Normal appearance. She is well-developed. She is obese. She is ill-appearing. She is not toxic-appearing or diaphoretic.  HENT:     Head: Normocephalic and atraumatic.     Right Ear: Tympanic membrane, ear canal and external ear normal. There is no impacted cerumen.     Left Ear: Tympanic membrane, ear canal and external ear normal. There is no impacted cerumen.     Nose: Congestion and rhinorrhea present.     Mouth/Throat:     Mouth: Mucous membranes are moist.     Pharynx: Oropharynx is clear. No oropharyngeal exudate or posterior oropharyngeal erythema.  Eyes:     General: No scleral icterus.       Right eye: No discharge.        Left eye: No discharge.     Extraocular Movements: Extraocular movements intact.     Conjunctiva/sclera: Conjunctivae normal.     Pupils: Pupils are equal, round, and reactive to light.  Cardiovascular:     Rate and Rhythm: Normal rate and regular rhythm.     Heart sounds: No murmur heard. Pulmonary:     Effort: Pulmonary effort is normal. No accessory muscle usage, respiratory distress or retractions.     Breath sounds: Normal breath sounds and air entry. No stridor, decreased air movement or transmitted upper airway sounds. No decreased breath sounds, wheezing, rhonchi or rales.  Abdominal:     Palpations: Abdomen is soft.     Tenderness: There is no abdominal tenderness.  Musculoskeletal:        General: No swelling.     Cervical back: Normal range of motion and neck supple. No rigidity or tenderness.  Lymphadenopathy:     Cervical: No cervical adenopathy.  Skin:    General: Skin is warm and dry.     Capillary Refill: Capillary refill takes less than 2 seconds.     Findings: No erythema or rash.  Neurological:     General: No focal deficit present.     Mental Status: She is alert and oriented to person, place, and time.  Psychiatric:        Mood and Affect: Mood normal.      UC Treatments / Results  Labs (all labs ordered are  listed, but only abnormal results are displayed) Labs Reviewed  POC SARS CORONAVIRUS 2 AG -  ED    EKG   Radiology DG Chest 2 View  Result Date: 04/27/2022 CLINICAL DATA:  hx of pneumonia, cough, chest discomfort and hemoptysis EXAM: CHEST - 2 VIEW COMPARISON:  Chest x-ray January 01, 2021. FINDINGS: The heart size and mediastinal contours are within normal limits. Both lungs are clear. The visualized skeletal structures are unremarkable. IMPRESSION: No active cardiopulmonary disease. Electronically Signed   By: Margaretha Sheffield M.D.   On: 04/27/2022 13:43    Procedures Procedures (including critical care time)  Medications Ordered in UC Medications - No data to display  Initial Impression / Assessment and Plan / UC Course  I have reviewed the triage vital signs and the nursing notes.  Pertinent labs & imaging results that were available during my care of the patient were reviewed by me and considered in my medical decision making (Beverly chart for details).     Viral URI with cough -COVID test negative, chest x-ray unremarkable.  Suspect this to be viral in nature.  Will start with Xyzal and Flonase to help with nasal congestion and postnasal drainage.  As needed cough medication as needed.  Vital signs stable.    Final Clinical Impressions(s) / UC Diagnoses   Final diagnoses:  Viral URI with cough     Discharge Instructions      Your chest x-ray is normal. Your COVID test is negative. Please take up to 600 mg of ibuprofen with food to help with your back pain and aches.  Do not take if you are still taking meloxicam however. I have prescribed you an antihistamine to help with your nasal congestion.  I have also prescribed you a nasal spray. Please reserve the cough syrup for nighttime use as it will make you drowsy.      ED Prescriptions     Medication Sig Dispense Auth. Provider   levocetirizine (XYZAL) 5 MG tablet Take 1 tablet (5 mg total) by mouth every evening.  30 tablet Lestat Golob L, PA   fluticasone (FLONASE) 50 MCG/ACT nasal spray Place 1 spray into both nostrils in the morning and at bedtime. 16 mL Katlynn Naser L, PA   promethazine-dextromethorphan (PROMETHAZINE-DM) 6.25-15 MG/5ML syrup Take 5 mLs by mouth 4 (four) times daily as needed for cough. 118 mL Jamesina Gaugh L, PA      PDMP not reviewed this encounter.   Chaney Malling, [place] 04/27/22 [phone]

## 2022-04-27 NOTE — Discharge Instructions (Addendum)
Your chest x-ray is normal. Your COVID test is negative. Please take up to 600 mg of ibuprofen with food to help with your back pain and aches.  Do not take if you are still taking meloxicam however. I have prescribed you an antihistamine to help with your nasal congestion.  I have also prescribed you a nasal spray. Please reserve the cough syrup for nighttime use as it will make you drowsy.

## 2022-04-27 NOTE — ED Triage Notes (Signed)
Cough since last Thursday  Cough is getting worse due to sinus drainage  Chills & body aches  Sore throat  Heart was  "racing during the night and woke me up x 2) Tylenol at 0900 (1 GM) Fatigue  OTC nyquil, alka seltzer plus, theraflu - min relief Has increased PO fluids - throat coat tea, hot  drinks, water  Back is also hurting from coughing

## 2022-06-09 ENCOUNTER — Encounter: Payer: Self-pay | Admitting: Physician Assistant

## 2022-06-09 ENCOUNTER — Ambulatory Visit (INDEPENDENT_AMBULATORY_CARE_PROVIDER_SITE_OTHER): Payer: PRIVATE HEALTH INSURANCE | Admitting: Physician Assistant

## 2022-06-09 VITALS — BP 134/67 | HR 89 | Wt 272.0 lb

## 2022-06-09 DIAGNOSIS — Z974 Presence of external hearing-aid: Secondary | ICD-10-CM | POA: Diagnosis not present

## 2022-06-09 DIAGNOSIS — H9113 Presbycusis, bilateral: Secondary | ICD-10-CM

## 2022-06-09 DIAGNOSIS — H6993 Unspecified Eustachian tube disorder, bilateral: Secondary | ICD-10-CM | POA: Diagnosis not present

## 2022-06-09 MED ORDER — PREDNISONE 20 MG PO TABS: ORAL_TABLET | ORAL | 0 refills | Status: DC

## 2022-06-09 NOTE — Progress Notes (Signed)
Acute Office Visit  Subjective:     Patient ID: Beverly Jackson, female    DOB: 03/21/1965, 57 y.o.   MRN: [id number]  Chief Complaint  Patient presents with   Ear Fullness    HPI Patient is in today for bilateral ear fullness for over a month. She wears hearing aids and having trouble hearing even with aids in. Pt denies any fever, chills, SOB, cough, sinus pressure or any URI symptoms. She did go to UC on 3/5 and dx with viral URI she was given xyzal and flonase with cough syrup. She denies any ear pain or drainage but continue to feel full.   .. 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   Fatigue 01/08/2015   Hypothyroidism (acquired) 01/08/2015   Vasomotor symptoms due to menopause 01/08/2015   Morbid obesity with BMI of 45.0-49.9, adult 01/08/2015   Obstructive sleep apnea 06/08/2019   Pancreatitis 08/12/2020   Dyspepsia 11/05/2020   Anxiety and depression 11/05/2020   History of migraine headaches 11/05/2020   Chronic cough with restrictive picture on PFTs 02/10/2021   Urinary incontinence 02/10/2021   Metabolic syndrome 03/03/2021   Brittle hair 10/14/2021   Presbycusis of both ears 06/09/2022   Does use hearing aid 06/09/2022   Dysfunction of both eustachian tubes 06/09/2022   Resolved Ambulatory Problems    Diagnosis Date Noted   Hypoglycemia 08/23/2018   Shaking 08/23/2018   Right ankle pain 08/23/2018   Morbid obesity 08/23/2018   Gross hematuria 09/27/2018   Acute right flank pain 09/27/2018   Urinary urgency 09/27/2018   Acute stress reaction  11/18/2018   Elevated ALT measurement 03/07/2019   Vitamin D insufficiency 03/07/2019   ADD (attention deficit disorder) 01/08/2015   Arthralgia of hand 07/29/2017   Gallstones 12/22/2010   Increased risk of breast cancer 07/29/2017   Influenza vaccine refused 02/05/2015   Middle ear effusion, left 05/15/2019   Benign positional vertigo 09/25/2019   Chronic left-sided low back pain 10/10/2019   Cervical cephalgia 03/20/2020   COVID-19 07/15/2020   Left temporomandibular joint disorder, unspecified 08/12/2020   Acute maxillary sinusitis 12/09/2020   Past Medical History:  Diagnosis Date   Anxiety    Cancer    Chest pain    Edema, lower extremity    Gallbladder disease    Hypertension    Joint pain    Low blood sugar    MVP (mitral valve prolapse)    Obesity    Osteoarthritis    Sleep apnea    Thyroid disease    Vitamin D deficiency      ROS  See HPI.     Objective:    BP 134/67   Pulse 89   Wt 272 lb (123.4 kg)   SpO2 98%   BMI 51.39 kg/m  BP Readings from Last 3 Encounters:  06/09/22 134/67  04/27/22 129/83  10/29/21 138/80   Wt Readings from Last 3 Encounters:  06/09/22 272 lb (123.4 kg)  04/27/22 250 lb (113.4 kg)  10/14/21 269 lb (122 kg)      Physical Exam Constitutional:  Appearance: Normal appearance. She is obese.  HENT:     Head: Normocephalic.     Right Ear: Ear canal and external ear normal. There is no impacted cerumen.     Left Ear: Ear canal and external ear normal. There is no impacted cerumen.     Ears:     Comments: TM appear full and slightly bulging but no erythema or purulent discharge. They have an opague cloudy appearance. No tenderness on exam.     Nose: Nose normal. No congestion or rhinorrhea.     Mouth/Throat:     Mouth: Mucous membranes are moist.  Eyes:     Conjunctiva/sclera: Conjunctivae normal.  Neck:     Vascular: No carotid bruit.  Cardiovascular:     Rate and Rhythm: Normal rate.  Pulmonary:     Effort:  Pulmonary effort is normal.  Musculoskeletal:     Cervical back: Normal range of motion and neck supple. No rigidity or tenderness.  Lymphadenopathy:     Cervical: No cervical adenopathy.  Neurological:     Mental Status: She is alert.  Psychiatric:        Mood and Affect: Mood normal.          Assessment & Plan:  Marland KitchenMarland KitchenAmy was seen today for ear fullness.  Diagnoses and all orders for this visit:  Dysfunction of both eustachian tubes -     predniSONE (DELTASONE) 20 MG tablet; Take 3 tablets for 3 days, take 2 tablets for 3 days, take 1 tablet for 3 days, and take 1/2 tablet for 4 days.  Presbycusis of both ears  Does use hearing aid  Reassured patient no signs of infection No significant cerumen accumulation TMs do appear cloudy and like fluid is behind them Continue flonase and xzyal  Added prednisone burst Follow up as needed if symptoms do not resolve or worsen   Tandy Gaw, PA-C

## 2022-06-25 ENCOUNTER — Other Ambulatory Visit: Payer: Self-pay | Admitting: Sports Medicine

## 2022-07-09 ENCOUNTER — Other Ambulatory Visit: Payer: Self-pay | Admitting: Sports Medicine

## 2022-07-09 DIAGNOSIS — M170 Bilateral primary osteoarthritis of knee: Secondary | ICD-10-CM

## 2022-07-11 DIAGNOSIS — I82409 Acute embolism and thrombosis of unspecified deep veins of unspecified lower extremity: Secondary | ICD-10-CM | POA: Insufficient documentation

## 2022-07-12 ENCOUNTER — Other Ambulatory Visit: Payer: Self-pay | Admitting: Sports Medicine

## 2022-07-13 ENCOUNTER — Telehealth: Payer: Self-pay | Admitting: General Practice

## 2022-07-13 NOTE — Transitions of Care (Post Inpatient/ED Visit) (Addendum)
07/13/2022  Name: Beverly Jackson MRN: [id number] DOB: 1965-09-06  Today's TOC FU Call Status: Today's TOC FU Call Status:: Successful TOC FU Call Competed TOC FU Call Complete Date: 07/13/22  Transition Care Management Follow-up Telephone Call Date of Discharge: 07/11/22 Discharge Facility: Other ([facility]) Name of Other ([facility]) Discharge Facility: [facility] Type of Discharge: Emergency Department Reason for ED Visit: Orthopedic Conditions Orthopedic/Injury Diagnosis:  (burisitis) How have you been since you were released from the hospital?: Better Any questions or concerns?: No  Items Reviewed: Did you receive and understand the discharge instructions provided?: Yes Medications obtained,verified, and reconciled?: Yes (Medications Reviewed) Any new allergies since your discharge?: No Dietary orders reviewed?: NA Do you have support at home?: Yes  Medications Reviewed Today: Medications Reviewed Today     Reviewed by Modesto Charon, RN (Registered Nurse) on 07/13/22 at 1059  Med List Status: <None>   Medication Order Taking? Sig Documenting Provider Last Dose Status Informant  ALPRAZolam (XANAX) 0.5 MG tablet [id number]  Take 1 tablet (0.5 mg total) by mouth 2 (two) times daily as needed for anxiety. Monica Becton, MD  Active Self  fluticasone [facility]) 50 MCG/ACT nasal spray [id number]  Place 1 spray into both nostrils in the morning and at bedtime. Guy Sandifer L, [place]  Active   furosemide (LASIX) 20 MG tablet [id number]  TAKE ONE TABLET BY MOUTH DAILY Monica Becton, MD  Active   levocetirizine (XYZAL) 5 MG tablet [id number]  Take 1 tablet (5 mg total) by mouth every evening. Guy Sandifer L, [place]  Active   levothyroxine (SYNTHROID) 50 MCG tablet [id number]  TAKE ONE TABLET BY MOUTH DAILY Monica Becton, MD  Active   meloxicam (MOBIC) 15 MG tablet [id number]  TAKE ONE TABLET BY MOUTH ONCE A DAY WITH A MEAL FOR 2 WEEKS THEN TAKE ONE TABLET BY MOUTH ONCE A  DAY AS NEEDED FOR PAIN Monica Becton, MD  Active   ondansetron (ZOFRAN-ODT) 8 MG disintegrating tablet [id number]  Take 1 tablet (8 mg total) by mouth every 8 (eight) hours as needed for nausea. Monica Becton, MD  Active   predniSONE (DELTASONE) 20 MG tablet [id number]  Take 3 tablets for 3 days, take 2 tablets for 3 days, take 1 tablet for 3 days, and take 1/2 tablet for 4 days. Jomarie Longs, PA-C  Active   promethazine-dextromethorphan (PROMETHAZINE-DM) 6.25-15 MG/5ML syrup [id number]  Take 5 mLs by mouth 4 (four) times daily as needed for cough. Crain, Whitney L, PA  Active   sertraline (ZOLOFT) 100 MG tablet [id number]  Take 1 tablet (100 mg total) by mouth daily. Monica Becton, MD  Active   spironolactone (ALDACTONE) 50 MG tablet [id number]  TAKE ONE TABLET BY MOUTH DAILY Monica Becton, MD  Active   tamoxifen (NOLVADEX) 20 MG tablet [id number]  Take 1 tablet by mouth daily. [provider]  Active   traMADol (ULTRAM) 50 MG tablet [id number] Yes Take by mouth. [provider]  Active   Vitamin D, Ergocalciferol, (DRISDOL) 1.25 MG (50000 UNIT) CAPS capsule [id number]  Take 1 capsule by mouth once a week. [provider]  Active             Home Care and Equipment/Supplies: Were Home Health Services Ordered?: NA Any new equipment or medical supplies ordered?: NA  Functional Questionnaire: Do you need assistance with bathing/showering or dressing?: No Do you need assistance with meal preparation?: No Do you need assistance with eating?: No Do you  have difficulty maintaining continence: No Do you need assistance with getting out of bed/getting out of a chair/moving?: No Do you have difficulty managing or taking your medications?: No  Follow up appointments reviewed: PCP Follow-up appointment confirmed?: Yes Date of PCP follow-up appointment?: 07/14/22 Follow-up Provider: Dr. Jena Gauss Follow-up  appointment confirmed?: NA Do you need transportation to your follow-up appointment?: No Do you understand care options if your condition(s) worsen?: Yes-patient verbalized understanding    SIGNATURE Modesto Charon, RN BSN

## 2022-07-14 ENCOUNTER — Ambulatory Visit (INDEPENDENT_AMBULATORY_CARE_PROVIDER_SITE_OTHER): Payer: PRIVATE HEALTH INSURANCE | Admitting: Sports Medicine

## 2022-07-14 ENCOUNTER — Encounter: Payer: Self-pay | Admitting: Sports Medicine

## 2022-07-14 VITALS — BP 136/83 | HR 93

## 2022-07-14 DIAGNOSIS — H6993 Unspecified Eustachian tube disorder, bilateral: Secondary | ICD-10-CM | POA: Diagnosis not present

## 2022-07-14 DIAGNOSIS — M170 Bilateral primary osteoarthritis of knee: Secondary | ICD-10-CM | POA: Diagnosis not present

## 2022-07-14 DIAGNOSIS — Z6841 Body Mass Index (BMI) 40.0 and over, adult: Secondary | ICD-10-CM

## 2022-07-14 MED ORDER — SPIRONOLACTONE 50 MG PO TABS: 50.0000 mg | ORAL_TABLET | Freq: Every day | ORAL | 3 refills | Status: DC

## 2022-07-14 MED ORDER — SEMAGLUTIDE (2 MG/DOSE) 8 MG/3ML ~~LOC~~ SOPN: PEN_INJECTOR | SUBCUTANEOUS | 3 refills | Status: DC

## 2022-07-14 MED ORDER — HYDROCODONE-ACETAMINOPHEN 10-325 MG PO TABS: 1.0000 | ORAL_TABLET | Freq: Three times a day (TID) | ORAL | 0 refills | Status: DC | PRN

## 2022-07-14 NOTE — Progress Notes (Signed)
    Procedures performed today:    None.  Independent interpretation of notes and tests performed by another provider:   None.  Brief History, Exam, Impression, and Recommendations:    Dysfunction of both eustachian tubes Pleasant 57 year old female who does have some hearing loss with hearing aids, had a viral URI about a month and a half ago, subsequently had some pressure in her ears, subsequently the pressure is improved but she continues to have great difficulty hearing, she also has significant tinnitus. 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 She is unable to clear her symptoms with nasal Valsalva. I did inform her that typically this improves over a period of months, but afterwards oftentimes we do need tympanostomy tubes. I am going to place a referral to ENT but I would like her to defer making appointment for about 2 months. Of note considering her tinnitus, pressure and hearing loss we do need to consider the possibility of Mnire's disease.  Primary osteoarthritis of both knees Known knee osteoarthritis, she had injections at an outside hospital left and right in February of this year. She had a recent visit with the ED for right leg pain and swelling, a DVT ultrasound was negative and there was no mention of a Baker's cyst. She was given tramadol which unfortunately is not helping her pain. At this point she has failed steroid injections, analgesics, she is a candidate for viscosupplementation. Considering her persistence of pain I would like to get an MRI to ensure that were not missing a large meniscal tear first. For insurance coverage purposes she has failed greater than 6 weeks of physical therapy, unrevealing x-rays. Tramadol ineffective so we will add some hydrocodone.  Morbid obesity with BMI of 45.0-49.9, adult [facility]) Did well on compounded semaglutide, she came off of it due to life  stressors. Restarting.    ____________________________________________ Ihor Austin. Benjamin Stain, M.D., ABFM., CAQSM., AME. Primary Care and Sports Medicine [facility]  Adjunct Professor of Family Medicine  [employer]  Restaurant manager, fast food

## 2022-07-14 NOTE — Assessment & Plan Note (Addendum)
Known knee osteoarthritis, she had injections at an outside hospital left and right in February of this year. She had a recent visit with the ED for right leg pain and swelling, a DVT ultrasound was negative and there was no mention of a Baker's cyst. She was given tramadol which unfortunately is not helping her pain. At this point she has failed steroid injections, analgesics, she is a candidate for viscosupplementation. Considering her persistence of pain I would like to get an MRI to ensure that were not missing a large meniscal tear first. For insurance coverage purposes she has failed greater than 6 weeks of physical therapy, unrevealing x-rays. Tramadol ineffective so we will add some hydrocodone.

## 2022-07-14 NOTE — Assessment & Plan Note (Signed)
Did well on compounded semaglutide, she came off of it due to life stressors. Restarting.

## 2022-07-14 NOTE — Assessment & Plan Note (Signed)
Pleasant 57 year old female who does have some hearing loss with hearing aids, had a viral URI about a month and a half ago, subsequently had some pressure in her ears, subsequently the pressure is improved but she continues to have great difficulty hearing, she also has significant tinnitus. 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 She is unable to clear her symptoms with nasal Valsalva. I did inform her that typically this improves over a period of months, but afterwards oftentimes we do need tympanostomy tubes. I am going to place a referral to ENT but I would like her to defer making appointment for about 2 months. Of note considering her tinnitus, pressure and hearing loss we do need to consider the possibility of Mnire's disease.

## 2022-07-18 ENCOUNTER — Other Ambulatory Visit: Payer: PRIVATE HEALTH INSURANCE

## 2022-07-19 ENCOUNTER — Ambulatory Visit: Payer: PRIVATE HEALTH INSURANCE

## 2022-07-25 ENCOUNTER — Ambulatory Visit (INDEPENDENT_AMBULATORY_CARE_PROVIDER_SITE_OTHER): Payer: PRIVATE HEALTH INSURANCE

## 2022-07-25 DIAGNOSIS — M1711 Unilateral primary osteoarthritis, right knee: Secondary | ICD-10-CM | POA: Diagnosis not present

## 2022-07-29 ENCOUNTER — Telehealth: Payer: Self-pay | Admitting: Sports Medicine

## 2022-07-29 DIAGNOSIS — M170 Bilateral primary osteoarthritis of knee: Secondary | ICD-10-CM

## 2022-07-29 MED ORDER — HYDROCODONE-ACETAMINOPHEN 10-325 MG PO TABS: 1.0000 | ORAL_TABLET | Freq: Three times a day (TID) | ORAL | 0 refills | Status: DC | PRN

## 2022-07-29 NOTE — Telephone Encounter (Signed)
Radiology is backed up, I look at the images myself, medially there is severe arthritis with loss of cartilage full thickness, the meniscus is diminutive and likely frayed/torn.  Sending in a short course of hydrocodone.

## 2022-07-29 NOTE — Telephone Encounter (Signed)
Patient called she doesn't have results from MRI yet and is asking for pain medication  Not sleeping please advise Her pharmacy is [facility] [phone]

## 2022-07-30 ENCOUNTER — Encounter: Payer: Self-pay | Admitting: Sports Medicine

## 2022-07-31 NOTE — Telephone Encounter (Signed)
I called [facility] to have the MRI read/resulted.

## 2022-08-03 ENCOUNTER — Ambulatory Visit (INDEPENDENT_AMBULATORY_CARE_PROVIDER_SITE_OTHER): Payer: PRIVATE HEALTH INSURANCE | Admitting: Sports Medicine

## 2022-08-03 ENCOUNTER — Telehealth: Payer: Self-pay | Admitting: Sports Medicine

## 2022-08-03 DIAGNOSIS — M170 Bilateral primary osteoarthritis of knee: Secondary | ICD-10-CM

## 2022-08-03 DIAGNOSIS — M47816 Spondylosis without myelopathy or radiculopathy, lumbar region: Secondary | ICD-10-CM | POA: Diagnosis not present

## 2022-08-03 MED ORDER — GABAPENTIN 300 MG PO CAPS: ORAL_CAPSULE | ORAL | 3 refills | Status: DC

## 2022-08-03 NOTE — Progress Notes (Signed)
    Procedures performed today:    None.  Independent interpretation of notes and tests performed by another provider:   None.  Brief History, Exam, Impression, and Recommendations:    Primary osteoarthritis of both knees Pleasant 57 year old female, knee arthritis, confirmed x-rays and MRIs. We did a steroid injection in September that only gave her a few days of relief, meloxicam and tramadol ineffective. We will go and get her approved for Orthovisc. Return to see me to start Orthovisc when approved.  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    ____________________________________________ Ihor Austin. Benjamin Stain, M.D., ABFM., CAQSM., AME. Primary Care and Sports Medicine [facility]  Adjunct Professor of Family Medicine  [place] [employer] [facility] [employer]  Restaurant manager, fast food

## 2022-08-03 NOTE — Assessment & Plan Note (Signed)
Pleasant 57 year old female, knee arthritis, confirmed x-rays and MRIs. We did a steroid injection in September that only gave her a few days of relief, meloxicam and tramadol ineffective. We will go and get her approved for Orthovisc. Return to see me to start Orthovisc when approved.

## 2022-08-03 NOTE — Telephone Encounter (Signed)
Bilateral [employer] approval please, x-ray confirmed arthritis, failed steroid injections, medications, 6 weeks of physical therapy

## 2022-08-03 NOTE — Assessment & Plan Note (Signed)
Thoracolumbar DDD, facet arthritis, having some right-sided dorsal midfoot radicular symptoms, adding Neurontin.

## 2022-08-07 NOTE — Telephone Encounter (Signed)
PA information submitted via [email or web address] for Orthovisc Paperwork has been printed and given to Dr. T for signatures. Once obtained, information will be faxed to [employer] at [phone]  

## 2022-08-10 NOTE — Telephone Encounter (Signed)
Patient has been approved for orthovisc no cost and was notified and will scheduled soon task complete.

## 2022-08-12 ENCOUNTER — Other Ambulatory Visit (INDEPENDENT_AMBULATORY_CARE_PROVIDER_SITE_OTHER): Payer: PRIVATE HEALTH INSURANCE

## 2022-08-12 ENCOUNTER — Ambulatory Visit (INDEPENDENT_AMBULATORY_CARE_PROVIDER_SITE_OTHER): Payer: PRIVATE HEALTH INSURANCE | Admitting: Sports Medicine

## 2022-08-12 DIAGNOSIS — M170 Bilateral primary osteoarthritis of knee: Secondary | ICD-10-CM | POA: Diagnosis not present

## 2022-08-12 MED ORDER — HYALURONAN 30 MG/2ML IX SOSY
30.0000 mg | PREFILLED_SYRINGE | Freq: Once | INTRA_ARTICULAR | Status: AC
  Administered 2022-08-12: 30 mg via INTRA_ARTICULAR

## 2022-08-12 MED ORDER — HYLAN G-F 20 16 MG/2ML IX SOSY: 16.0000 mg | PREFILLED_SYRINGE | Freq: Once | INTRA_ARTICULAR | Status: DC

## 2022-08-12 NOTE — Assessment & Plan Note (Signed)
Today we did steroid and Orthovisc No. 1 for both knees, return to see me in 1 week for #2 of 4.

## 2022-08-12 NOTE — Progress Notes (Signed)
    Procedures performed today:    Procedure: Real-time Ultrasound Guided injection of the right knee Device: Samsung HS60  Verbal informed consent obtained.  Time-out conducted.  Noted no overlying erythema, induration, or other signs of local infection.  Skin prepped in a sterile fashion.  Local anesthesia: Topical Ethyl chloride.  With sterile technique and under real time ultrasound guidance: Trace effusion noted, 1 cc Kenalog 40, 2 cc lidocaine, 2 cc bupivacaine injected easily syringe switched and 30 mg/2 mL of OrthoVisc (sodium hyaluronate) in a prefilled syringe was injected easily into the knee through a 22-gauge needle. Completed without difficulty  Advised to call if fevers/chills, erythema, induration, drainage, or persistent bleeding.  Images permanently stored and available for review in PACS.  Impression: Technically successful ultrasound guided injection.   Procedure: Real-time Ultrasound Guided injection of the left knee Device: Samsung HS60  Verbal informed consent obtained.  Time-out conducted.  Noted no overlying erythema, induration, or other signs of local infection.  Skin prepped in a sterile fashion.  Local anesthesia: Topical Ethyl chloride.  With sterile technique and under real time ultrasound guidance: Trace effusion noted, 1 cc Kenalog 40, 2 cc lidocaine, 2 cc bupivacaine injected easily syringe switched and 30 mg/2 mL of OrthoVisc (sodium hyaluronate) in a prefilled syringe was injected easily into the knee through a 22-gauge needle. Completed without difficulty  Advised to call if fevers/chills, erythema, induration, drainage, or persistent bleeding.  Images permanently stored and available for review in PACS.  Impression: Technically successful ultrasound guided injection.  Independent interpretation of notes and tests performed by another provider:   None.  Brief History, Exam, Impression, and Recommendations:    Primary osteoarthritis of both  knees Today we did steroid and Orthovisc No. 1 for both knees, return to see me in 1 week for #2 of 4.    ____________________________________________ Ihor Austin. Benjamin Stain, M.D., ABFM., CAQSM., AME. Primary Care and Sports Medicine [facility]  Adjunct Professor of Family Medicine  [place] of [facility] [employer]  Restaurant manager, fast food

## 2022-08-24 ENCOUNTER — Other Ambulatory Visit (INDEPENDENT_AMBULATORY_CARE_PROVIDER_SITE_OTHER): Payer: PRIVATE HEALTH INSURANCE

## 2022-08-24 ENCOUNTER — Ambulatory Visit (INDEPENDENT_AMBULATORY_CARE_PROVIDER_SITE_OTHER): Payer: PRIVATE HEALTH INSURANCE | Admitting: Sports Medicine

## 2022-08-24 DIAGNOSIS — M170 Bilateral primary osteoarthritis of knee: Secondary | ICD-10-CM | POA: Diagnosis not present

## 2022-08-24 NOTE — Assessment & Plan Note (Signed)
Pleasant 57 year old female, today we did Orthovisc No. 2 of 4 both knees, we did steroid last visit. Return in 1 week for #3 of 4.

## 2022-08-24 NOTE — Progress Notes (Signed)
    Procedures performed today:    Procedure: Real-time Ultrasound Guided injection of the left knee Device: Samsung HS60  Verbal informed consent obtained.  Time-out conducted.  Noted no overlying erythema, induration, or other signs of local infection.  Skin prepped in a sterile fashion.  Local anesthesia: Topical Ethyl chloride.  With sterile technique and under real time ultrasound guidance: Trace effusion noted, 30 mg/2 mL of OrthoVisc (sodium hyaluronate) in a prefilled syringe was injected easily into the knee through a 22-gauge needle.   Completed without difficulty  Advised to call if fevers/chills, erythema, induration, drainage, or persistent bleeding.  Images permanently stored and available for review in PACS.  Impression: Technically successful ultrasound guided injection.   Procedure: Real-time Ultrasound Guided injection of the right knee Device: Samsung HS60  Verbal informed consent obtained.  Time-out conducted.  Noted no overlying erythema, induration, or other signs of local infection.  Skin prepped in a sterile fashion.  Local anesthesia: Topical Ethyl chloride.  With sterile technique and under real time ultrasound guidance: Trace effusion noted, 30 mg/2 mL of OrthoVisc (sodium hyaluronate) in a prefilled syringe was injected easily into the knee through a 22-gauge needle.   Completed without difficulty  Advised to call if fevers/chills, erythema, induration, drainage, or persistent bleeding.  Images permanently stored and available for review in PACS.  Impression: Technically successful ultrasound guided injection.  Independent interpretation of notes and tests performed by another provider:   None.  Brief History, Exam, Impression, and Recommendations:    Primary osteoarthritis of both knees Beverly Jackson 57 year old female, today we did Orthovisc No. 2 of 4 both knees, we did steroid last visit. Return in 1 week for #3 of  4.    ____________________________________________ Ihor Austin. Benjamin Stain, M.D., ABFM., CAQSM., AME. Primary Care and Sports Medicine [facility]  Adjunct Professor of Family Medicine  [place] [employer] [facility] [employer]  Restaurant manager, fast food

## 2022-08-31 ENCOUNTER — Ambulatory Visit: Payer: PRIVATE HEALTH INSURANCE | Admitting: Sports Medicine

## 2022-09-02 ENCOUNTER — Ambulatory Visit: Payer: PRIVATE HEALTH INSURANCE | Admitting: Sports Medicine

## 2022-09-02 ENCOUNTER — Other Ambulatory Visit (INDEPENDENT_AMBULATORY_CARE_PROVIDER_SITE_OTHER): Payer: PRIVATE HEALTH INSURANCE

## 2022-09-02 DIAGNOSIS — M170 Bilateral primary osteoarthritis of knee: Secondary | ICD-10-CM | POA: Diagnosis not present

## 2022-09-02 MED ORDER — HYALURONAN 30 MG/2ML IX SOSY
30.0000 mg | PREFILLED_SYRINGE | Freq: Once | INTRA_ARTICULAR | Status: AC
  Administered 2022-09-02: 30 mg via INTRA_ARTICULAR

## 2022-09-02 NOTE — Addendum Note (Signed)
Addended by: Carren Rang A on: 09/02/2022 03:16 PM   Modules accepted: Orders

## 2022-09-02 NOTE — Progress Notes (Signed)
    Procedures performed today:    Procedure: Real-time Ultrasound Guided injection of the left knee Device: Samsung HS60  Verbal informed consent obtained.  Time-out conducted.  Noted no overlying erythema, induration, or other signs of local infection.  Skin prepped in a sterile fashion.  Local anesthesia: Topical Ethyl chloride.  With sterile technique and under real time ultrasound guidance: Trace effusion noted, 30 mg/2 mL of OrthoVisc (sodium hyaluronate) in a prefilled syringe was injected easily into the knee through a 22-gauge needle. Completed without difficulty  Advised to call if fevers/chills, erythema, induration, drainage, or persistent bleeding.  Images permanently stored and available for review in PACS.  Impression: Technically successful ultrasound guided injection.   Procedure: Real-time Ultrasound Guided injection of the right knee Device: Samsung HS60  Verbal informed consent obtained.  Time-out conducted.  Noted no overlying erythema, induration, or other signs of local infection.  Skin prepped in a sterile fashion.  Local anesthesia: Topical Ethyl chloride.  With sterile technique and under real time ultrasound guidance: Trace effusion noted, 30 mg/2 mL of OrthoVisc (sodium hyaluronate) in a prefilled syringe was injected easily into the knee through a 22-gauge needle. Completed without difficulty  Advised to call if fevers/chills, erythema, induration, drainage, or persistent bleeding.  Images permanently stored and available for review in PACS.  Impression: Technically successful ultrasound guided injection.  Independent interpretation of notes and tests performed by another provider:   None.  Brief History, Exam, Impression, and Recommendations:    Primary osteoarthritis of both knees Orthovisc 3 of 4 both knees, return in 1 week for #4 of 4.    ____________________________________________ Wali Reinheimer J. Jakhia Buxton, M.D., ABFM., CAQSM., AME. Primary  Care and Sports Medicine [facility]  Adjunct Professor of Family Medicine  [employer]  [employer] Aviation Medical Examiner 

## 2022-09-02 NOTE — Assessment & Plan Note (Signed)
Orthovisc 3 of 4 both knees, return in 1 week for #4 of 4 

## 2022-09-09 ENCOUNTER — Ambulatory Visit: Payer: PRIVATE HEALTH INSURANCE | Admitting: Sports Medicine

## 2022-09-14 ENCOUNTER — Other Ambulatory Visit (INDEPENDENT_AMBULATORY_CARE_PROVIDER_SITE_OTHER): Payer: PRIVATE HEALTH INSURANCE

## 2022-09-14 ENCOUNTER — Ambulatory Visit (INDEPENDENT_AMBULATORY_CARE_PROVIDER_SITE_OTHER): Payer: PRIVATE HEALTH INSURANCE | Admitting: Sports Medicine

## 2022-09-14 DIAGNOSIS — M170 Bilateral primary osteoarthritis of knee: Secondary | ICD-10-CM | POA: Diagnosis not present

## 2022-09-14 MED ORDER — HYALURONAN 30 MG/2ML IX SOSY
30.0000 mg | PREFILLED_SYRINGE | Freq: Once | INTRA_ARTICULAR | Status: AC
  Administered 2022-09-14: 30 mg via INTRA_ARTICULAR

## 2022-09-14 NOTE — Progress Notes (Signed)
    Procedures performed today:    Procedure: Real-time Ultrasound Guided injection of the left knee Device: Samsung HS60  Verbal informed consent obtained.  Time-out conducted.  Noted no overlying erythema, induration, or other signs of local infection.  Skin prepped in a sterile fashion.  Local anesthesia: Topical Ethyl chloride.  With sterile technique and under real time ultrasound guidance: Trace effusion noted, 30 mg/2 mL of OrthoVisc (sodium hyaluronate) in a prefilled syringe was injected easily into the knee through a 22-gauge needle.   Completed without difficulty  Advised to call if fevers/chills, erythema, induration, drainage, or persistent bleeding.  Images permanently stored and available for review in PACS.  Impression: Technically successful ultrasound guided injection.   Procedure: Real-time Ultrasound Guided injection of the right knee Device: Samsung HS60  Verbal informed consent obtained.  Time-out conducted.  Noted no overlying erythema, induration, or other signs of local infection.  Skin prepped in a sterile fashion.  Local anesthesia: Topical Ethyl chloride.  With sterile technique and under real time ultrasound guidance: Trace effusion noted, 30 mg/2 mL of OrthoVisc (sodium hyaluronate) in a prefilled syringe was injected easily into the knee through a 22-gauge needle.   Completed without difficulty  Advised to call if fevers/chills, erythema, induration, drainage, or persistent bleeding.  Images permanently stored and available for review in PACS.  Impression: Technically successful ultrasound guided injection.  Independent interpretation of notes and tests performed by another provider:   None.  Brief History, Exam, Impression, and Recommendations:    Primary osteoarthritis of both knees Still having significant pain pain, today we did Orthovisc No. 4 of 4 both knees, if this fails we will consider refilling tramadol versus increasing to Tylenol 3 or  4. The next procedure would be geniculate artery embolization if insufficient improvement with all of the above.    ____________________________________________ Ihor Austin. Benjamin Stain, M.D., ABFM., CAQSM., AME. Primary Care and Sports Medicine [facility]  Adjunct Professor of Family Medicine  [place] of [facility] [employer] Medicine  Restaurant manager, fast food

## 2022-09-14 NOTE — Addendum Note (Signed)
Addended by: Carren Rang A on: 09/14/2022 02:12 PM   Modules accepted: Orders

## 2022-09-14 NOTE — Assessment & Plan Note (Signed)
Still having significant pain pain, today we did Orthovisc No. 4 of 4 both knees, if this fails we will consider refilling tramadol versus increasing to Tylenol 3 or 4. The next procedure would be geniculate artery embolization if insufficient improvement with all of the above.

## 2022-11-13 ENCOUNTER — Other Ambulatory Visit: Payer: Self-pay | Admitting: Sports Medicine

## 2022-11-13 DIAGNOSIS — M170 Bilateral primary osteoarthritis of knee: Secondary | ICD-10-CM

## 2022-11-13 DIAGNOSIS — F419 Anxiety disorder, unspecified: Secondary | ICD-10-CM

## 2022-12-17 ENCOUNTER — Ambulatory Visit: Payer: PRIVATE HEALTH INSURANCE | Admitting: Sports Medicine

## 2022-12-17 ENCOUNTER — Other Ambulatory Visit (INDEPENDENT_AMBULATORY_CARE_PROVIDER_SITE_OTHER): Payer: PRIVATE HEALTH INSURANCE

## 2022-12-17 ENCOUNTER — Encounter: Payer: Self-pay | Admitting: Sports Medicine

## 2022-12-17 DIAGNOSIS — M47816 Spondylosis without myelopathy or radiculopathy, lumbar region: Secondary | ICD-10-CM

## 2022-12-17 DIAGNOSIS — M170 Bilateral primary osteoarthritis of knee: Secondary | ICD-10-CM

## 2022-12-17 NOTE — Progress Notes (Signed)
    Procedures performed today:    Procedure: Real-time Ultrasound Guided injection of the left knee Device: Samsung HS60  Verbal informed consent obtained.  Time-out conducted.  Noted no overlying erythema, induration, or other signs of local infection.  Skin prepped in a sterile fashion.  Local anesthesia: Topical Ethyl chloride.  With sterile technique and under real time ultrasound guidance: Mild effusion noted, 1 cc Kenalog 40, 2 cc lidocaine, 2 cc bupivacaine injected easily Completed without difficulty  Advised to call if fevers/chills, erythema, induration, drainage, or persistent bleeding.  Images permanently stored and available for review in PACS.  Impression: Technically successful ultrasound guided injection.   Procedure: Real-time Ultrasound Guided injection of the right knee Device: Samsung HS60  Verbal informed consent obtained.  Time-out conducted.  Noted no overlying erythema, induration, or other signs of local infection.  Skin prepped in a sterile fashion.  Local anesthesia: Topical Ethyl chloride.  With sterile technique and under real time ultrasound guidance: Mild effusion noted, 1 cc Kenalog 40, 2 cc lidocaine, 2 cc bupivacaine injected easily Completed without difficulty  Advised to call if fevers/chills, erythema, induration, drainage, or persistent bleeding.  Images permanently stored and available for review in PACS.  Impression: Technically successful ultrasound guided injection.  Independent interpretation of notes and tests performed by another provider:   None.  Brief History, Exam, Impression, and Recommendations:    Primary osteoarthritis of both knees Known osteoarthritis, severe. Significant pain, we have finished physical therapy, steroid injections, and a series of Orthovisc back in July, did not respond. Today we will do repeat bilateral steroid injections and refer for geniculate artery embolization.  Lumbar radiculopathy, right Known  thoracolumbar DDD and facet arthritis, right sided dorsal midfoot radicular symptoms, not better with Neurontin, adding additional home physical therapy, lumbar spine x-rays today. If insufficient improvement after the due diligence of 6 weeks of conservative treatment we will proceed with MRI for epidural planning.    ____________________________________________ Ihor Austin. Benjamin Stain, M.D., ABFM., CAQSM., AME. Primary Care and Sports Medicine [place] [facility]  Adjunct Professor of Family Medicine  [place] of [facility] [employer]  Restaurant manager, fast food

## 2022-12-17 NOTE — Assessment & Plan Note (Signed)
Known osteoarthritis, severe. Significant pain, we have finished physical therapy, steroid injections, and a series of Orthovisc back in July, did not respond. Today we will do repeat bilateral steroid injections and refer for geniculate artery embolization.

## 2022-12-17 NOTE — Assessment & Plan Note (Signed)
Known thoracolumbar DDD and facet arthritis, right sided dorsal midfoot radicular symptoms, not better with Neurontin, adding additional home physical therapy, lumbar spine x-rays today. If insufficient improvement after the due diligence of 6 weeks of conservative treatment we will proceed with MRI for epidural planning.

## 2022-12-18 DIAGNOSIS — M170 Bilateral primary osteoarthritis of knee: Secondary | ICD-10-CM

## 2022-12-18 MED ORDER — TRIAMCINOLONE ACETONIDE 40 MG/ML IJ SUSP
80.0000 mg | Freq: Once | INTRAMUSCULAR | Status: AC
  Administered 2022-12-18: 80 mg via INTRAMUSCULAR

## 2022-12-18 NOTE — Addendum Note (Signed)
Addended by: Carren Rang A on: 12/18/2022 07:48 AM   Modules accepted: Orders

## 2022-12-30 ENCOUNTER — Ambulatory Visit: Payer: PRIVATE HEALTH INSURANCE

## 2022-12-30 DIAGNOSIS — M419 Scoliosis, unspecified: Secondary | ICD-10-CM

## 2022-12-30 DIAGNOSIS — M47816 Spondylosis without myelopathy or radiculopathy, lumbar region: Secondary | ICD-10-CM

## 2023-01-18 NOTE — Progress Notes (Signed)
Chief Complaint: Patient was seen in virtual telephone consultation today for bilateral knee pain   Referring Physician(s): Thekkekandam,Thomas J  History of Present Illness: Beverly Jackson is a 57 y.o. female with a medical history significant for HTN, anxiety, obesity currently taking Ozempic, lumbar radiculopathy, left breast cancer s/p lumpectomy with radiation therapy in 2019. She completed her 5 years of anti-endocrine therapy May 2024. She also has a history of primary osteoarthritis of both knees with severe, chronic pain. She has failed conservative management which has included physical therapy, numerous steroid injections and the Orthovisc series in July 2024. She has been referred to our team to discuss geniculate artery embolization.  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  She is hesitant to pursue arthroplasty given the time she would have to take away from work in recovery.   Past Medical History:  Diagnosis Date   Anxiety    Cancer (HCC)    Chest pain    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   Edema, lower extremity    Gallbladder disease    Hypertension    Joint pain    Low blood sugar    MVP (mitral valve prolapse)    Obesity    Osteoarthritis    Sleep apnea    Thyroid disease    Vitamin D deficiency     Past Surgical History:  Procedure Laterality Date   ABDOMINAL HYSTERECTOMY     BREAST SURGERY     CESAREAN SECTION     CHOLECYSTECTOMY      Allergies: Mixed ragweed, Penicillins, and Short ragweed pollen ext  Medications: Prior to Admission medications   Medication Sig Start Date End Date Taking? Authorizing Provider  fluticasone (FLONASE) 50 MCG/ACT nasal spray Place 1 spray into both nostrils in the morning and at bedtime. 04/27/22   Guy Sandifer L, PA  furosemide (LASIX) 20 MG tablet TAKE ONE TABLET BY MOUTH DAILY 03/27/22   Monica Becton, MD  gabapentin (NEURONTIN) 300 MG capsule One tab PO qHS for a week, then  BID for a week, then TID. May double weekly to a max of 3,600mg /day 08/03/22   Monica Becton, MD  levocetirizine (XYZAL) 5 MG tablet Take 1 tablet (5 mg total) by mouth every evening. 04/27/22   Guy Sandifer L, PA  levothyroxine (SYNTHROID) 50 MCG tablet TAKE ONE TABLET BY MOUTH DAILY 06/25/22   Monica Becton, MD  meloxicam (MOBIC) 15 MG tablet TAKE 1 TABLET BY MOUTH DAILY WITH A MEAL FOR 2 WEEKS, THEN TAKE 1 TABLET BY MOUTH DAILY AS NEEDED FOR PAIN 11/13/22   Monica Becton, MD  Semaglutide, 2 MG/DOSE, 8 MG/3ML SOPN Semaglutide 2.5 mg/mL + Vit B6 10mg /mL. Inject 10u/0.5mL/0.25 mg subcu weekly x4 weeks then 20u/0.66mL/0.5 mg subcu weekly x4 weeks, then 40u/0.83mL/1 mg subcu weekly x4 weeks then 68u/0.45mL/1.7mg  subcu weekly x4 weeks then 100u/46mL/2.5mg  subcu weekly. 07/14/22   Monica Becton, MD  sertraline (ZOLOFT) 100 MG tablet TAKE 1 TABLET BY MOUTH DAILY 11/13/22   Monica Becton, MD  spironolactone (ALDACTONE) 50 MG tablet Take 1 tablet (50 mg total) by mouth daily. 07/14/22   Monica Becton, MD  Vitamin D, Ergocalciferol, (DRISDOL) 1.25 MG (50000 UNIT) CAPS capsule Take 1 capsule by mouth once a week. 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  Tobacco Use   Smoking status: Never   Smokeless tobacco: Never  Vaping Use   Vaping status: Never Used  Substance and Sexual Activity   Alcohol use: Never   Drug  use: Never   Sexual activity: Never  Other Topics Concern   Not on file  Social History Narrative   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, Hairdresser   Social Determinants of Health   Financial Resource Strain: Low Risk  (04/13/2022)   Received from [facility]   Overall Financial Resource Strain (CARDIA)    Difficulty of Paying Living Expenses: Not hard at all  Food Insecurity: No Food Insecurity (04/13/2022)   [facility] from [facility]   Hunger Vital Sign    Worried About Running Out of Food in the Last Year: Never true    Ran Out of Food in the Last Year: Never true  Transportation Needs: No Transportation Needs (07/13/2022)   PRAPARE - Administrator, Civil Service (Medical): No    Lack of Transportation (Non-Medical): No  Physical Activity: Unknown (04/13/2022)   [facility]   Exercise Vital Sign    Days of Exercise per Week: 0 days    Minutes of Exercise per Session: Not on file  Stress: No Stress Concern Present (04/13/2022)   Received from [facility] of Occupational Health - Occupational Stress Questionnaire    Feeling of Stress : Not at all  Social Connections: Socially Integrated (04/13/2022)   [facility]   Social Network    How would you rate your social network (family, work, friends)?: Good participation with social networks     Review of Systems: A 12 point ROS discussed and pertinent positives are indicated in the HPI above.  All other systems are negative.  Vital Signs: There were no vitals taken for this visit.  No physical exam was performed in lieu of virtual telephone visit.   Imaging: R Knee MR 07/25/22   Bilateral knees 07/26/2018  Kellgren and Lyman Bishop grade 3 bilaterally, R>L  Labs:  CBC: No results for input(s): "WBC", "HGB", "HCT", "PLT" in the last 8760 hours.  COAGS: No results for input(s): "INR", "APTT"  in the last 8760 hours.  BMP: No results for input(s): "NA", "K", "CL", "CO2", "GLUCOSE", "BUN", "CALCIUM", "CREATININE", "GFRNONAA", "GFRAA" in the last 8760 hours.  Invalid input(s): "CMP"  LIVER FUNCTION TESTS: No results for input(s): "BILITOT", "AST", "ALT", "ALKPHOS", "PROT", "ALBUMIN" in the last 8760 hours.  TUMOR MARKERS: No results for input(s): "AFPTM", "CEA", "CA199", "CHROMGRNA" in the last 8760 hours.  Assessment and Plan: 57 year old female with a history of R>L primary osteoarthritis of both knees with severe, chronic pain. She would be an excellent candidate for geniculate artery embolization for pain control.  We discussed the rationale, periprocedural expectations including risks and benefits, and long term expectations.  She is amenable and wishes to  proceed.  We will plan to start with the right side, and if effective, can pursue the left at a later date.  Plan for right geniculate artery embolization, antegrade femoral approach, with moderate sedation at DRI-LB.  She reports prior post-op nausea and vomiting, and has had good relief with scopolamine patch.  We discussed IV zofran which she has tolerated well.   Marliss Coots, MD Pager: [phone]    I spent a total of  40 Minutes   in virtual telephone clinical consultation, greater than 50% of which was counseling/coordinating care for bilateral knee osteoarthritis.

## 2023-01-20 ENCOUNTER — Ambulatory Visit
Admission: RE | Admit: 2023-01-20 | Discharge: 2023-01-20 | Disposition: A | Discharge status: Home / Self Care | Payer: PRIVATE HEALTH INSURANCE | Source: Ambulatory Visit | Attending: Sports Medicine | Admitting: Sports Medicine

## 2023-01-20 DIAGNOSIS — M170 Bilateral primary osteoarthritis of knee: Secondary | ICD-10-CM

## 2023-01-20 HISTORY — PX: IR RADIOLOGIST EVAL & MGMT: IMG5224

## 2023-02-03 ENCOUNTER — Other Ambulatory Visit: Payer: Self-pay | Admitting: Interventional Radiology

## 2023-02-03 DIAGNOSIS — G8929 Other chronic pain: Secondary | ICD-10-CM

## 2023-02-19 NOTE — Discharge Instructions (Signed)
 Discharge Instructions for Genicular Artery Embolization (GAE)   Post-Procedure Care   Activity:   Rest for the remainder of the day.   Avoid strenuous activities and heavy lifting for 48 hours.   Gradually resume normal activities as tolerated.   Pain Management:   You may experience mild pain or discomfort at the catheter insertion site or in the knee. This is normal.   Use over-the-counter pain relievers such as acetaminophen (Tylenol) or ibuprofen (Advil) as directed.   Apply an ice pack to the knee for 15-20 minutes every 2-3 hours to reduce swelling and discomfort.   Take Sol-Medrol Pack as directed per pharmacy.   Wound Care:   Keep the catheter insertion site clean and dry.   Remove the bandage after 24 hours and replace it with a clean, dry bandage if needed.   Avoid soaking in baths, hot tubs, or swimming pools for 5 days. Showers are allowed.   Medications:   Take prescribed medications as directed.   If you were taking blood thinners, follow your physician's instructions on when to resume them.   Diet:   Resume your normal diet.   Drink plenty of fluids to stay hydrated.   Follow-Up:   Schedule a follow-up appointment with your physician as instructed.   Contact your physician if you experience increased pain, swelling, redness, or drainage at the insertion site, or if you have a fever over 100.14F (38C).   When to Seek Immediate Medical Attention   Call [phone] with any concerns:   Signs of infection at the catheter site (redness, warmth, pus).   Sudden weakness or numbness in the leg.      Call 911 if:   Difficulty breathing or chest pain.   You have severe pain in your abdomen, and it does not get better with medicine.     You have leg pain or leg swelling.   You feel dizzy, or you faint.   Do not wait to see if the symptoms will go away.   Do not drive yourself to the hospital.   Please ensure you follow these instructions  carefully and reach out to your healthcare provider if you have any concerns or questions. Wishing you a smooth and speedy recovery!

## 2023-02-22 ENCOUNTER — Ambulatory Visit
Admission: RE | Admit: 2023-02-22 | Discharge: 2023-02-22 | Disposition: A | Discharge status: Home / Self Care | Payer: PRIVATE HEALTH INSURANCE | Source: Ambulatory Visit | Attending: Family Medicine | Admitting: Family Medicine

## 2023-02-22 ENCOUNTER — Other Ambulatory Visit: Payer: Self-pay

## 2023-02-22 VITALS — BP 158/79 | HR 99 | Temp 98.4°F

## 2023-02-22 DIAGNOSIS — J209 Acute bronchitis, unspecified: Secondary | ICD-10-CM | POA: Diagnosis not present

## 2023-02-22 LAB — POCT INFLUENZA A/B
Influenza A, POC: NEGATIVE
Influenza B, POC: NEGATIVE

## 2023-02-22 LAB — POC SARS CORONAVIRUS 2 AG -  ED: SARS Coronavirus 2 Ag: NEGATIVE

## 2023-02-22 MED ORDER — DOXYCYCLINE HYCLATE 100 MG PO CAPS: 100.0000 mg | ORAL_CAPSULE | Freq: Two times a day (BID) | ORAL | 0 refills | Status: DC

## 2023-02-22 MED ORDER — HYDROCOD POLI-CHLORPHE POLI ER 10-8 MG/5ML PO SUER: 5.0000 mL | Freq: Two times a day (BID) | ORAL | 0 refills | Status: DC | PRN

## 2023-02-22 NOTE — ED Triage Notes (Signed)
Sick since last Tuesday, has had body aches, cough (blood tinged sputum), no energy, no appetite, lips chapped, headache. Has chest discomfort from coughing. Last night took nyquil. Has been taking tylenol and dayquil also. Unsure if has had fever.

## 2023-02-22 NOTE — Discharge Instructions (Signed)
Make sure you are drinking lots of fluids Take the doxycycline 2 times a day.  Take this medicine with food I have prescribed a stronger cough medicine for you.  This can be used twice a day.  Do not drive on [place] See your doctor if not improving by the end of the week

## 2023-02-22 NOTE — ED Provider Notes (Signed)
[facility]    CSN: [id number] Arrival date & time: 02/22/23  1024      History   Chief Complaint Chief Complaint  Patient presents with   Cough    Body aches, no energy, very congested, runny nose. - Entered by patient    HPI Beverly Jackson is a 57 y.o. female.   HPI  Patient is on her seventh day of illness.  She feels significantly ill.  Very tired and weak.  Body aches and headache.  No energy, no appetite.  Coughing and chest congestion.  Nasal congestion and postnasal drip.  Mild sore throat.  Has been taking over-the-counter medications.  Past Medical History:  Diagnosis Date   Anxiety    Cancer (HCC)    Chest pain    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   Edema, lower extremity    Gallbladder disease    Hypertension    Joint pain    Low blood sugar    MVP (mitral valve prolapse)    Obesity    Osteoarthritis    Sleep apnea    Thyroid disease    Vitamin D deficiency     Patient Active Problem List   Diagnosis Date Noted   Presbycusis of both ears 06/09/2022   Does use hearing aid 06/09/2022   Dysfunction of both eustachian tubes 06/09/2022   Brittle hair 10/14/2021   Metabolic syndrome 03/03/2021   Chronic cough with restrictive picture on PFTs 02/10/2021   Urinary incontinence 02/10/2021   Dyspepsia 11/05/2020   Anxiety and depression 11/05/2020   History of migraine headaches 11/05/2020   Pancreatitis 08/12/2020   Obstructive sleep apnea 06/08/2019   Prediabetes 03/07/2019   Lumbar radiculopathy, right 09/28/2018   History of nephrolithiasis 09/27/2018   History of hysterectomy for benign disease 09/27/2018   Edema of left lower extremity 09/27/2018   History of breast cancer 08/23/2018   Screening for colon cancer 08/23/2018   Primary osteoarthritis of both knees 08/23/2018   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   Fatigue 01/08/2015   Hypothyroidism (acquired) 01/08/2015   Vasomotor symptoms due to  menopause 01/08/2015   Morbid obesity with BMI of 45.0-49.9, adult (HCC) 01/08/2015    Past Surgical History:  Procedure Laterality Date   ABDOMINAL HYSTERECTOMY     BREAST SURGERY     CESAREAN SECTION     CHOLECYSTECTOMY     IR RADIOLOGIST EVAL & MGMT  01/20/2023    OB History     Gravida  2   Para  2   Term      Preterm      AB      Living         SAB      IAB      Ectopic      Multiple      Live Births               Home Medications    Prior to Admission medications   Medication Sig Start Date End Date Taking? Authorizing Provider  chlorpheniramine-HYDROcodone (TUSSIONEX) 10-8 MG/5ML Take 5 mLs by mouth every 12 (twelve) hours as needed for cough. 02/22/23  Yes Eustace Moore, MD  doxycycline (VIBRAMYCIN) 100 MG capsule Take 1 capsule (100 mg total) by mouth 2 (two) times daily. 02/22/23  Yes Eustace Moore, MD  furosemide (LASIX) 20 MG tablet TAKE ONE TABLET BY MOUTH DAILY 03/27/22   Monica Becton, MD  levothyroxine (SYNTHROID)  50 MCG tablet TAKE ONE TABLET BY MOUTH DAILY 06/25/22   Monica Becton, MD  meloxicam (MOBIC) 15 MG tablet TAKE 1 TABLET BY MOUTH DAILY WITH A MEAL FOR 2 WEEKS, THEN TAKE 1 TABLET BY MOUTH DAILY AS NEEDED FOR PAIN 11/13/22   Monica Becton, MD  Semaglutide, 2 MG/DOSE, 8 MG/3ML SOPN Semaglutide 2.5 mg/mL + Vit B6 10mg /mL. Inject 10u/0.21mL/0.25 mg subcu weekly x4 weeks then 20u/0.53mL/0.5 mg subcu weekly x4 weeks, then 40u/0.60mL/1 mg subcu weekly x4 weeks then 68u/0.59mL/1.7mg  subcu weekly x4 weeks then 100u/1mL/2.5mg  subcu weekly. 07/14/22   Monica Becton, MD  sertraline (ZOLOFT) 100 MG tablet TAKE 1 TABLET BY MOUTH DAILY 11/13/22   Monica Becton, MD  spironolactone (ALDACTONE) 50 MG tablet Take 1 tablet (50 mg total) by mouth daily. 07/14/22   Monica Becton, MD  Vitamin D, Ergocalciferol, (DRISDOL) 1.25 MG (50000 UNIT) CAPS capsule Take 1 capsule by mouth once a week. 03/02/22    [provider]    Family History Family History  Problem Relation Age of Onset   Hypertension Mother    Depression Mother    Atrial fibrillation Mother    Thyroid disease Mother    Hyperlipidemia Mother    Stroke Mother    Cancer Mother    Obesity Mother    Cancer Father     Social History Social History   Tobacco Use   Smoking status: Never   Smokeless tobacco: Never  Vaping Use   Vaping status: Never Used  Substance Use Topics   Alcohol use: Never   Drug use: Never     Allergies   Mixed ragweed, Penicillins, and Short ragweed pollen ext   Review of Systems Review of Systems See HPI  Physical Exam Triage Vital Signs ED Triage Vitals  Encounter Vitals Group     BP 02/22/23 1108 (!) 158/79     Systolic BP Percentile --      Diastolic BP Percentile --      Pulse Rate 02/22/23 1108 99     Resp --      Temp 02/22/23 1108 98.4 F (36.9 C)     Temp src --      SpO2 02/22/23 1108 98 %     Weight --      Height --      Head Circumference --      Peak Flow --      Pain Score 02/22/23 1113 8     Pain Loc --      Pain Education --      Exclude from Growth Chart --    No data found.  Updated Vital Signs BP (!) 158/79   Pulse 99   Temp 98.4 F (36.9 C)   SpO2 98%      Physical Exam Constitutional:      General: She is in acute distress.     Appearance: She is well-developed. She is obese. She is ill-appearing.     Comments: Has to hold onto furniture in order to ambulate.  Gets onto exam table with difficulty.  Short of breath with conversation.  HENT:     Head: Normocephalic and atraumatic.     Ears:     Comments: Right TM is scarred.  Mild erythema.  Left TM has tympanostomy tube and yellow purulence in canal    Nose: Congestion present. No rhinorrhea.     Mouth/Throat:     Mouth: Mucous membranes are moist.  Pharynx: Posterior oropharyngeal erythema present.  Eyes:     Conjunctiva/sclera: Conjunctivae normal.     Pupils: Pupils  are equal, round, and reactive to light.  Cardiovascular:     Rate and Rhythm: Normal rate and regular rhythm.     Heart sounds: Normal heart sounds.  Pulmonary:     Effort: Pulmonary effort is normal. No respiratory distress.     Breath sounds: Normal breath sounds.  Abdominal:     General: There is no distension.     Palpations: Abdomen is soft.  Musculoskeletal:        General: Normal range of motion.     Cervical back: Normal range of motion.  Skin:    General: Skin is warm and dry.  Neurological:     Mental Status: She is alert.     Motor: Weakness present.     Gait: Gait abnormal.     Comments: Generalized weakness      UC Treatments / Results  Labs (all labs ordered are listed, but only abnormal results are displayed) Labs Reviewed  POCT INFLUENZA A/B  POC SARS CORONAVIRUS 2 AG -  ED    EKG   Radiology No results found.  Procedures Procedures (including critical care time)  Medications Ordered in UC Medications - No data to display  Initial Impression / Assessment and Plan / UC Course  I have reviewed the triage vital signs and the nursing notes.  Pertinent labs & imaging results that were available during my care of the patient were reviewed by me and considered in my medical decision making (see chart for details).    Flu and COVID test are negative.  Concerned that patient is worsening after 7 days of illness with profound weakness. Final Clinical Impressions(s) / UC Diagnoses   Final diagnoses:  Acute bronchitis, unspecified organism     Discharge Instructions      Make sure you are drinking lots of fluids Take the doxycycline 2 times a day.  Take this medicine with food I have prescribed a stronger cough medicine for you.  This can be used twice a day.  Do not drive on [place] See your doctor if not improving by the end of the week   ED Prescriptions     Medication Sig Dispense Auth. Provider   doxycycline (VIBRAMYCIN) 100 MG capsule  Take 1 capsule (100 mg total) by mouth 2 (two) times daily. 20 capsule Eustace Moore, MD   chlorpheniramine-HYDROcodone (TUSSIONEX) 10-8 MG/5ML Take 5 mLs by mouth every 12 (twelve) hours as needed for cough. 115 mL Eustace Moore, MD      I have reviewed the PDMP during this encounter.   Eustace Moore, MD 02/22/23 [phone]

## 2023-02-26 ENCOUNTER — Ambulatory Visit: Payer: PRIVATE HEALTH INSURANCE

## 2023-02-26 ENCOUNTER — Encounter: Payer: Self-pay | Admitting: Medical-Surgical

## 2023-02-26 ENCOUNTER — Ambulatory Visit (INDEPENDENT_AMBULATORY_CARE_PROVIDER_SITE_OTHER): Payer: PRIVATE HEALTH INSURANCE | Admitting: Medical-Surgical

## 2023-02-26 ENCOUNTER — Ambulatory Visit: Payer: Self-pay | Admitting: Sports Medicine

## 2023-02-26 ENCOUNTER — Inpatient Hospital Stay
Admission: RE | Admit: 2023-02-26 | Discharge: 2023-02-26 | Disposition: A | Discharge status: Home / Self Care | Payer: PRIVATE HEALTH INSURANCE | Source: Ambulatory Visit | Attending: Interventional Radiology | Admitting: Interventional Radiology

## 2023-02-26 VITALS — BP 133/83 | HR 94 | Resp 20 | Ht 61.0 in | Wt 267.3 lb

## 2023-02-26 DIAGNOSIS — R051 Acute cough: Secondary | ICD-10-CM

## 2023-02-26 DIAGNOSIS — J329 Chronic sinusitis, unspecified: Secondary | ICD-10-CM | POA: Diagnosis not present

## 2023-02-26 DIAGNOSIS — H66001 Acute suppurative otitis media without spontaneous rupture of ear drum, right ear: Secondary | ICD-10-CM

## 2023-02-26 DIAGNOSIS — J40 Bronchitis, not specified as acute or chronic: Secondary | ICD-10-CM

## 2023-02-26 MED ORDER — ALBUTEROL SULFATE HFA 108 (90 BASE) MCG/ACT IN AERS: 2.0000 | INHALATION_SPRAY | Freq: Four times a day (QID) | RESPIRATORY_TRACT | 0 refills | Status: AC | PRN

## 2023-02-26 MED ORDER — PREDNISONE 50 MG PO TABS: 50.0000 mg | ORAL_TABLET | Freq: Every day | ORAL | 0 refills | Status: DC

## 2023-02-26 MED ORDER — AZITHROMYCIN 250 MG PO TABS: ORAL_TABLET | ORAL | 0 refills | Status: AC

## 2023-02-26 NOTE — Progress Notes (Signed)
        Established patient visit  History, exam, impression, and plan:  1. Acute cough (Primary) Pleasant 58 year old female presenting today for evaluation of 11 days of upper respiratory symptoms.  Has had significant fatigue with cough that is productive of yellow mucus.  Has had several episodes where she coughed up what felt like rocks as they were very solid and uncomfortable.  Gets short of breath with coughing episodes and has bilateral rib pain from coughing.  Was seen at urgent care where they provided her a prescription for doxycycline  and she is taking this as prescribed.  Unfortunately, she is midway through her prescription and is feeling no better.  On evaluation, lungs show diminished breath sounds throughout all lobes posteriorly.  Cough is strong.  Tachypnea noted with mild distress on exertion.  Plan for chest x-ray today.  Finish out doxycycline  course.  See below. - DG Chest 2 View; Future  2. Sinobronchitis As noted above, the 11 days of symptoms accompanied by right ear pain, cough, and poor response to doxycycline .  She is allergic to penicillins so avoiding Augmentin.  Recommend finishing out doxycycline  as prescribed.  Adding prednisone  50 mg daily x 5 days.  Adding an albuterol  inhaler.  Because she has poor response to doxycycline , feel that she may benefit from the addition of something to cover atypicals.  Adding azithromycin .  If no improvement in symptoms by Monday, advised her to reach back out. - predniSONE  (DELTASONE ) 50 MG tablet; Take 1 tablet (50 mg total) by mouth daily with breakfast.  Dispense: 5 tablet; Refill: 0 - albuterol  (VENTOLIN  HFA) 108 (90 Base) MCG/ACT inhaler; Inhale 2 puffs into the lungs every 6 (six) hours as needed for wheezing or shortness of breath.  Dispense: 8 g; Refill: 0 - azithromycin  (ZITHROMAX ) 250 MG tablet; Take 2 tablets on day 1, then 1 tablet daily on days 2 through 5  Dispense: 6 tablet; Refill: 0  3. Non-recurrent acute  suppurative otitis media of right ear without spontaneous rupture of tympanic membrane On exam, she has an T-tube in the left ear.  The right ear canal is erythematous with a cloudy bulging and dull right TM consistent with otitis media.  Continue doxycycline , adding azithromycin . - azithromycin  (ZITHROMAX ) 250 MG tablet; Take 2 tablets on day 1, then 1 tablet daily on days 2 through 5  Dispense: 6 tablet; Refill: 0   Procedures performed this visit: None.  Return if symptoms worsen or fail to improve.  __________________________________ Zada FREDRIK Palin, DNP, APRN, FNP-BC Primary Care and Sports Medicine [facility] [place]

## 2023-02-26 NOTE — Telephone Encounter (Signed)
 Patient scheduled.

## 2023-02-26 NOTE — Telephone Encounter (Signed)
 Copied from CRM [id number]. Topic: Clinical - Red Word Triage >> Feb 26, 2023  1:15 PM [name] ORN wrote: [facility] that prompted transfer to Nurse Triage: Patient is on her 11th day of illness went to urgent care 12/30 Dx with acute bronchitis, wheezing, intermittent shortness of breath.   No energy, no appetite.  Coughing and chest congestion.  Nasal congestion and postnasal drip.  Mild sore throat.   taking over-the-counter medications. and prescription nothing working   Was given:   doxycycline  (VIBRAMYCIN ) 100 MG   chlorpheniramine-HYDROcodone  (TUSSIONEX) 10-8 MG/5ML   Chief Complaint: Symptoms on Antibiotic Follow Up Symptoms: Wheezing, Coughing, Fatigue Frequency: Since Monday Pertinent Negatives: Patient denies chest pain or shortness of breath Disposition: [] ED /[] Urgent Care (no appt availability in office) / [x] Appointment(In office/virtual)/ []  [facility]/ [] Home Care/ [] Refused Recommended Disposition /[] [facility]/ [x]  Follow-up with PCP Additional Notes: AM is a 58 year old female being triaged today for symptoms persistent after an infection, the patient needs to be seen in office today, and an appointment was made for today.     Reason for Disposition  [1] Taking antibiotic > 72 hours (3 days) AND [2] symptoms (other than fever) not improved  Answer Assessment - Initial Assessment Questions 1. INFECTION: What infection is the antibiotic being given for?     Bronchitis  2. ANTIBIOTIC: What antibiotic are you taking How many times per day?     Doxycycline   3. DURATION: When was the antibiotic started?     Monday  4. MAIN CONCERN OR SYMPTOM:  What is your main concern right now?     Wheezing, Fatigue, Cough  5. BETTER-SAME-WORSE: Are you getting better, staying the same, or getting worse compared to when you first started the antibiotics? If getting worse, ask: In what way?      Same  6. FEVER: Do you have a fever? If Yes, ask:  What is your temperature, how was it measured, and when did it start?     No  7. SYMPTOMS: Are there any other symptoms you're concerned about? If Yes, ask: When did it start?     Wheezing, Coughing,  8. FOLLOW-UP APPOINTMENT: Do you have a follow-up appointment with your doctor?     No  Protocols used: Infection on Antibiotic Follow-up Call-[facility]

## 2023-03-12 ENCOUNTER — Other Ambulatory Visit: Payer: PRIVATE HEALTH INSURANCE

## 2023-03-17 ENCOUNTER — Telehealth: Payer: Self-pay

## 2023-03-17 ENCOUNTER — Other Ambulatory Visit: Payer: Self-pay | Admitting: Sports Medicine

## 2023-03-17 DIAGNOSIS — M170 Bilateral primary osteoarthritis of knee: Secondary | ICD-10-CM

## 2023-03-17 NOTE — Telephone Encounter (Signed)
That is exactly how I want her to take it, I thought it was pretty clear on the sig.  LOL.

## 2023-03-17 NOTE — Progress Notes (Signed)
Chief Complaint: Patient was seen in consultation today for bilateral knee pain; right knee geniculate artery embolization.   Referring Physician(s): Thekkekandam,Thomas J   History of Present Illness: Beverly Jackson is a 58 y.o. female with a medical history significant for HTN, anxiety, obesity currently taking Ozempic, lumbar radiculopathy and left breast cancer s/p lumpectomy with radiation therapy in 2019. She completed her 5 years of anti-endocrine therapy May 2024. She also has a history of primary osteoarthritis of both knees with severe, chronic pain. She has failed conservative management which has included physical therapy, numerous steroid injections and the Orthovisc series in July 2024. She was referred to our team to discuss geniculate artery embolization and she met with me in consultation 01/20/23.  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 She is hesitant to pursue arthroplasty given the time she would have to take away from work in recovery.  We discussed the rationale, periprocedural expectations including risks and benefits, and long-term expectations of geniculate artery embolization. We discussed treating the right side followed possibly by the left at a later date. She was in agreement with this plan and she presents today for a right geniculate artery embolization.  Past Medical History:  Diagnosis Date   Anxiety    Cancer (HCC)    Chest pain    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   Edema, lower extremity    Gallbladder disease    Hypertension    Joint pain    Low blood sugar    MVP (mitral valve prolapse)    Obesity    Osteoarthritis    Sleep apnea    Thyroid disease    Vitamin D deficiency     Past Surgical History:  Procedure Laterality Date   ABDOMINAL HYSTERECTOMY     BREAST SURGERY     CESAREAN SECTION     CHOLECYSTECTOMY     IR RADIOLOGIST EVAL & MGMT  01/20/2023    Allergies: Penicillins and Short ragweed pollen  ext  Medications: Prior to Admission medications   Medication Sig Start Date End Date Taking? Authorizing Provider  albuterol (VENTOLIN HFA) 108 (90 Base) MCG/ACT inhaler Inhale 2 puffs into the lungs every 6 (six) hours as needed for wheezing or shortness of breath. 02/26/23   Christen Butter, NP  chlorpheniramine-HYDROcodone (TUSSIONEX) 10-8 MG/5ML Take 5 mLs by mouth every 12 (twelve) hours as needed for cough. 02/22/23   Eustace Moore, MD  doxycycline (VIBRAMYCIN) 100 MG capsule Take 1 capsule (100 mg total) by mouth 2 (two) times daily. 02/22/23   Eustace Moore, MD  furosemide (LASIX) 20 MG tablet TAKE ONE TABLET BY MOUTH DAILY 03/27/22   Monica Becton, MD  levothyroxine (SYNTHROID) 50 MCG tablet TAKE ONE TABLET BY MOUTH DAILY 06/25/22   Monica Becton, MD  meloxicam (MOBIC) 15 MG tablet TAKE 1 TABLET BY MOUTH DAILY WITH MEALS FOR 2 WEEKS. THEN TAKE 1 TABLET BY MOUTH DAILY AS NEEDED FOR PAIN 03/17/23   Monica Becton, MD  predniSONE (DELTASONE) 50 MG tablet Take 1 tablet (50 mg total) by mouth daily with breakfast. 02/26/23   Christen Butter, NP  sertraline (ZOLOFT) 100 MG tablet TAKE 1 TABLET BY MOUTH DAILY 11/13/22   Monica Becton, MD  spironolactone (ALDACTONE) 50 MG tablet Take 1 tablet (50 mg total) by mouth daily. 07/14/22   Monica Becton, MD  Vitamin D, Ergocalciferol, (DRISDOL) 1.25 MG (50000 UNIT) CAPS capsule Take 1 capsule by mouth once  a week. 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, [facility]   Overall Financial Resource Strain (CARDIA)    Difficulty of Paying Living Expenses: Not hard at all  Food Insecurity: No Food Insecurity (04/13/2022)   Received from [facility], [facility]   Hunger Vital Sign    Worried About Running Out of Food in the Last Year: Never true    Ran Out of Food in the Last Year: Never true  Transportation Needs: No Transportation Needs (07/13/2022)   PRAPARE - Administrator, Civil Service (Medical): No    Lack of Transportation (Non-Medical): No  Physical Activity: Unknown (04/13/2022)   Received from [facility], [facility]   Exercise Vital Sign    Days of Exercise per Week: 0 days    Minutes of Exercise per Session: Not on file  Stress: No Stress Concern Present (04/13/2022)   Received from [facility], [facility] of Occupational Health - Occupational Stress Questionnaire    Feeling of Stress : Not at all  Social Connections: Socially Integrated (04/13/2022)   Received from [facility], [facility]   Social Network    How would you rate your social network (family, work, friends)?: Good participation with social networks    Review of Systems: A 12 point ROS discussed and pertinent positives are indicated in the HPI above.  All other systems are negative.   Vital Signs: There were no vitals taken for this visit.  Physical Exam Constitutional:      General: She is not in acute  distress. HENT:     Head: Normocephalic.     Mouth/Throat:     Mouth: Mucous membranes are moist.     Comments: MP2 Eyes:     General: No scleral icterus. Cardiovascular:     Rate and Rhythm: Normal rate and regular rhythm.  Pulmonary:     Effort: No respiratory distress.  Abdominal:     General: There is no distension.  Musculoskeletal:     Right lower leg: No edema.     Left lower leg: No edema.       Legs:     Comments: TTP  Neurological:     Mental Status: She is alert.     Imaging: R Knee MR 07/25/22  Bilateral knees 07/26/2018  Kellgren and Lawrence grade 3 bilaterally, R>L   DG Chest 2 View Result Date: 03/05/2023 CLINICAL DATA:  58 year old female with cough EXAM: CHEST - 2 VIEW COMPARISON:  04/27/2022 FINDINGS: Cardiomediastinal silhouette unchanged in size and contour. No evidence of central vascular congestion. No interlobular septal thickening. Low lung volumes No pneumothorax or pleural effusion. Coarsened interstitial markings, with no confluent airspace disease. No acute displaced fracture. Degenerative changes of the spine. IMPRESSION: Low lung volumes without evidence of acute cardiopulmonary disease Electronically Signed   By: Gilmer Mor D.O.   On: 03/05/2023 16:06    Labs:  CBC: No results for input(s): "WBC", "HGB", "HCT", "PLT" in the last 8760 hours.  COAGS: No results for input(s): "INR", "APTT" in the last 8760 hours.  BMP: No results for input(s): "NA", "K", "CL", "CO2", "GLUCOSE", "BUN", "CALCIUM", "CREATININE", "GFRNONAA", "GFRAA" in the last 8760 hours.  Invalid input(s): "CMP"  LIVER FUNCTION TESTS: No results for input(s): "BILITOT", "AST", "ALT", "ALKPHOS", "PROT", "ALBUMIN" in the last 8760 hours.  TUMOR MARKERS: No results for input(s): "AFPTM", "CEA", "CA199", "CHROMGRNA" in the last 8760 hours.  Assessment and Plan:  Bilateral knee pain, right > left: Trudee Grip, 58 year old female, presents today for an image-guided right  geniculate artery embolization.   Risks and benefits of right geniculate artery embolization were discussed with the patient including, but not limited to bleeding, infection, vascular injury or contrast induced renal failure.  All of the patient's questions were answered, patient is agreeable to proceed. She has been NPO.   Consent signed and in chart.  Marliss Coots, MD Pager: [phone]    I spent a total of  30 Minutes   in face to face in clinical consultation, greater than 50% of which was counseling/coordinating care for bilateral knee pain.

## 2023-03-17 NOTE — Telephone Encounter (Signed)
Call transferred from [facility]. Karin Golden pharmacist is requesting clarification on the directions for meloxicam. Per pharmacist, is the patient to take 1 tab with a meal daily for 2 weeks. Then 1 tablet as needed? Please confirm. Thanks in advance.

## 2023-03-18 MED ORDER — METHYLPREDNISOLONE 4 MG PO TBPK: ORAL_TABLET | ORAL | 0 refills | Status: DC

## 2023-03-18 NOTE — Telephone Encounter (Signed)
Patient called and reminded about upcoming GAE procedure on 03/19/23 and told that medrol pak was called in to [facility]. It is to be taken as prescribed after her GAE procedure.

## 2023-03-18 NOTE — Telephone Encounter (Signed)
Task completed. Left a msg on the pharmacist line regarding provider's SIG/Directions for meloxicam rx.

## 2023-03-18 NOTE — Progress Notes (Signed)
Patient called and reminded about upcoming GAE procedure on 03/19/23 and told that medrol pak was called in to [facility] in [place]. She understands that is to be taken as prescribed after her GAE procedure.Patient also given instructions on arrival time of 0730, procedure, NPO status and the importance of having a driver. All questions were answered and patient verbalized understanding.

## 2023-03-18 NOTE — Discharge Instructions (Addendum)
Discharge Instructions for Genicular Artery Embolization (GAE)   Post-Procedure Care   Activity:   Rest for the remainder of the day.   Avoid strenuous activities and heavy lifting for 48 hours.   Gradually resume normal activities as tolerated.   Pain Management:   You may experience mild pain or discomfort at the catheter insertion site or in the knee. This is normal.   Use over-the-counter pain relievers such as acetaminophen (Tylenol) or ibuprofen (Advil) as directed.   Apply an ice pack to the knee for 15-20 minutes every 2-3 hours to reduce swelling and discomfort.   Take Sol-Medrol Pack as directed per pharmacy.   Wound Care:   Keep the catheter insertion site clean and dry.   Remove the bandage after 24 hours and replace it with a clean, dry bandage if needed.   Avoid soaking in baths, hot tubs, or swimming pools for 5 days. Showers are allowed.   Medications:   Take prescribed medications as directed.   If you were taking blood thinners, follow your physician's instructions on when to resume them.   Diet:   Resume your normal diet.   Drink plenty of fluids to stay hydrated.   Follow-Up:   Schedule a follow-up appointment with your physician as instructed.   Contact your physician if you experience increased pain, swelling, redness, or drainage at the insertion site, or if you have a fever over 100.64F (38C).   When to Seek Immediate Medical Attention   Call [phone] with any concerns:   Signs of infection at the catheter site (redness, warmth, pus).   Sudden weakness or numbness in the leg.      Call 911 if:   Difficulty breathing or chest pain.   You have severe pain in your abdomen, and it does not get better with medicine.     You have leg pain or leg swelling.   You feel dizzy, or you faint.   Do not wait to see if the symptoms will go away.   Do not drive yourself to the hospital.   Please ensure you follow these instructions  carefully and reach out to your healthcare provider if you have any concerns or questions. Wishing you a smooth and speedy recovery!  If you need to speak to someone after hours please call the on call MD at [phone]. Tell them you're a patient of Dr. Elby Showers and you had a GAE and the issues you are having.

## 2023-03-19 ENCOUNTER — Ambulatory Visit
Admission: RE | Admit: 2023-03-19 | Discharge: 2023-03-19 | Disposition: A | Discharge status: Home / Self Care | Payer: PRIVATE HEALTH INSURANCE | Source: Ambulatory Visit | Attending: Interventional Radiology | Admitting: Interventional Radiology

## 2023-03-19 DIAGNOSIS — G8929 Other chronic pain: Secondary | ICD-10-CM

## 2023-03-19 HISTORY — PX: IR EMBO ARTERIAL NOT HEMORR HEMANG INC GUIDE ROADMAPPING: IMG5448

## 2023-03-19 MED ORDER — FENTANYL CITRATE (PF) 100 MCG/2ML IJ SOLN
INTRAMUSCULAR | Status: AC | PRN
  Administered 2023-03-19 (×3): 50 ug via INTRAVENOUS

## 2023-03-19 MED ORDER — DEXAMETHASONE SODIUM PHOSPHATE 10 MG/ML IJ SOLN
10.0000 mg | Freq: Once | INTRAMUSCULAR | Status: AC
  Administered 2023-03-19: 10 mg via INTRAVENOUS

## 2023-03-19 MED ORDER — ACETAMINOPHEN 10 MG/ML IV SOLN
1000.0000 mg | Freq: Once | INTRAVENOUS | Status: AC
  Administered 2023-03-19: 1000 mg via INTRAVENOUS

## 2023-03-19 MED ORDER — KETOROLAC TROMETHAMINE 30 MG/ML IJ SOLN
30.0000 mg | Freq: Once | INTRAMUSCULAR | Status: AC
  Administered 2023-03-19: 30 mg via INTRAVENOUS

## 2023-03-19 MED ORDER — SODIUM CHLORIDE 0.9 % IV SOLN: INTRAVENOUS | Status: DC

## 2023-03-19 MED ORDER — MIDAZOLAM HCL 2 MG/2ML IJ SOLN: 1.0000 mg | INTRAMUSCULAR | Status: DC | PRN

## 2023-03-19 MED ORDER — MIDAZOLAM HCL 2 MG/2ML IJ SOLN
INTRAMUSCULAR | Status: AC | PRN
  Administered 2023-03-19: 1 mg via INTRAVENOUS
  Administered 2023-03-19: .5 mg via INTRAVENOUS
  Administered 2023-03-19: 1 mg via INTRAVENOUS
  Administered 2023-03-19: .5 mg via INTRAVENOUS
  Administered 2023-03-19: 1 mg via INTRAVENOUS

## 2023-03-19 MED ORDER — HYDROMORPHONE HCL 1 MG/ML IJ SOLN: INTRAMUSCULAR | Status: AC | PRN

## 2023-03-19 MED ORDER — FENTANYL CITRATE PF 50 MCG/ML IJ SOSY: 25.0000 ug | PREFILLED_SYRINGE | INTRAMUSCULAR | Status: DC | PRN

## 2023-03-19 NOTE — Progress Notes (Signed)
Patient back in nursing recovery area at 0945. Pt still drowsy from procedure but will wake up when spoken to. Pt follows commands, talks in complete sentences and has no complaints at this time. Pt will remain in nursing station until discharge.

## 2023-03-19 NOTE — Procedures (Signed)
Interventional Radiology Procedure Note  Procedure: Right geniculate artery embolization   Findings: Please refer to procedural dictation for full description. 0.6 mL dilute 100-300 micron embozene particles administered to superior medial geniculate artery.  Right SFA 4 Fr access.  Complications: None immediate  Estimated Blood Loss: < 5 mL  Recommendations: 2 hour bedrest. IR will arrange for 1 month clinic follow up.   Marliss Coots, MD Pager: [phone]

## 2023-03-26 NOTE — Progress Notes (Signed)
Phone call to pt to follow up from her GAE on 03/19/23. Pt. Reports that her knee pain is still significant when walking. Pt. Denies any signs of bleeding, infection, redness at the right femoral site, drainage at the site, fever, nausea, or vomiting. Dr. Elby Showers will call her within the next week to check on her status post-procedure. Pt advised to call back if anything were to change or any concerns arise and we will arrange an in person appointment, I ensured she had the oncal IR MD phone number as well. Pt verbalized understanding.

## 2023-04-01 ENCOUNTER — Other Ambulatory Visit: Payer: Self-pay | Admitting: Sports Medicine

## 2023-04-30 ENCOUNTER — Ambulatory Visit: Payer: PRIVATE HEALTH INSURANCE | Admitting: Sports Medicine

## 2023-05-12 ENCOUNTER — Other Ambulatory Visit: Payer: Self-pay | Admitting: Interventional Radiology

## 2023-05-12 DIAGNOSIS — M1711 Unilateral primary osteoarthritis, right knee: Secondary | ICD-10-CM

## 2023-06-13 NOTE — Progress Notes (Signed)
 This encounter was conducted via the [employer] providing interactive audio and visual communication. The patient provided verbal consent to conduct a virtual appointment. The patient was located at their primary residence during this encounter.  Referring Physician(s): Gean Keels   Chief Complaint: The patient is seen in virtual video follow up today s/p right geniculate artery embolization. 03/19/23  History of present illness: Beverly Jackson is a 58 y.o. female with a medical history significant for HTN, anxiety, obesity currently taking Ozempic , lumbar radiculopathy and left breast cancer s/p lumpectomy with radiation therapy in 2019. She completed her 5 years of anti-endocrine therapy May 2024. She also has a history of primary osteoarthritis of both knees with severe, chronic pain. She has failed conservative management which has included physical therapy, numerous steroid injections and the Orthovisc series in July 2024. She was referred to our team to discuss geniculate artery embolization and she met with me in consultation 01/20/23. 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 She is hesitant to pursue arthroplasty given the time she would have to take away from work in recovery.   We discussed the rationale, periprocedural expectations including risks and benefits, and long-term expectations of geniculate artery embolization. We discussed treating the right side followed possibly by the left at a later date. She was in agreement with this plan and she presented 03/19/23 for a right geniculate artery embolization. She tolerated the procedure well and was discharged home the same day.   She presents today for follow up via virtual video visit. She reports being very pleased with the outcome of her GAE.  She reports an average VAS of 2/10 now.  The pain she does have she attributes to standing a lot as a Producer, television/film/video.  She is interested in having her left knee  treated, which is more of a 5/10 on the VAS.    Past Medical History:  Diagnosis Date   Anxiety    Cancer (HCC)    Chest pain    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   Edema, lower extremity    Gallbladder disease    Hypertension    Joint pain    Low blood sugar    MVP (mitral valve prolapse)    Obesity    Osteoarthritis    Sleep apnea    Thyroid disease    Vitamin D  deficiency     Past Surgical History:  Procedure Laterality Date   ABDOMINAL HYSTERECTOMY     BREAST SURGERY     CESAREAN SECTION     CHOLECYSTECTOMY     IR EMBO ARTERIAL NOT HEMORR HEMANG INC GUIDE ROADMAPPING  03/19/2023   IR RADIOLOGIST EVAL & MGMT  01/20/2023    Allergies: Penicillins and Short ragweed pollen ext  Medications: Prior to Admission medications   Medication Sig Start Date End Date Taking? Authorizing Provider  albuterol  (VENTOLIN  HFA) 108 (90 Base) MCG/ACT inhaler Inhale 2 puffs into the lungs every 6 (six) hours as needed for wheezing or shortness of breath. 02/26/23   Cherre Cornish, NP  chlorpheniramine-HYDROcodone  (TUSSIONEX) 10-8 MG/5ML Take 5 mLs by mouth every 12 (twelve) hours as needed for cough. 02/22/23   Stephany Ehrich, MD  doxycycline  (VIBRAMYCIN ) 100 MG capsule Take 1 capsule (100 mg total) by mouth 2 (two) times daily. 02/22/23   Stephany Ehrich, MD  furosemide  (LASIX ) 20 MG tablet TAKE 1 TABLET BY MOUTH DAILY 04/01/23   Gean Keels, MD  levothyroxine  (SYNTHROID ) 50  MCG tablet TAKE ONE TABLET BY MOUTH DAILY 06/25/22   Gean Keels, MD  meloxicam  (MOBIC ) 15 MG tablet TAKE 1 TABLET BY MOUTH DAILY WITH MEALS FOR 2 WEEKS. THEN TAKE 1 TABLET BY MOUTH DAILY AS NEEDED FOR PAIN 03/17/23   Gean Keels, MD  methylPREDNISolone  (MEDROL  DOSEPAK) 4 MG TBPK tablet Taper Per Instructions 03/18/23   Federico Hopkins, MD  predniSONE  (DELTASONE ) 50 MG tablet Take 1 tablet (50 mg total) by mouth daily with breakfast. 02/26/23   Cherre Cornish, NP  sertraline   (ZOLOFT ) 100 MG tablet TAKE 1 TABLET BY MOUTH DAILY 11/13/22   Gean Keels, MD  spironolactone  (ALDACTONE ) 50 MG tablet Take 1 tablet (50 mg total) by mouth daily. 07/14/22   Gean Keels, MD  Vitamin D , Ergocalciferol , (DRISDOL ) 1.25 MG (50000 UNIT) CAPS capsule Take 1 capsule by mouth once a week. 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  Tobacco Use   Smoking status: Never   Smokeless tobacco: Never  Vaping Use   Vaping status: Never Used  Substance and Sexual Activity   Alcohol use: Never   Drug use: Never   Sexual activity: Never  Other Topics Concern   Not on file  Social History Narrative   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, Hairdresser   Social Drivers of Health   Financial Resource Strain: Low Risk  (04/13/2022)   Received [facility], [facility]   Overall Financial Resource Strain (CARDIA)    Difficulty of Paying Living Expenses: Not hard at all  Food Insecurity: No Food Insecurity (04/13/2022)   Received from [facility], [facility]   Hunger Vital Sign    Worried About Running Out of Food in the Last Year: Never true    Ran Out of Food in the Last Year: Never true  Transportation Needs: No Transportation Needs (07/13/2022)   PRAPARE - Administrator, Civil Service (Medical): No    Lack of Transportation (Non-Medical): No  Physical Activity: Unknown (04/13/2022)   Received from [facility], [facility]   Exercise Vital Sign    Days of Exercise per Week: 0 days    Minutes of Exercise per Session: Not on file  Stress: No Stress Concern Present (04/13/2022)   Received from [facility], [facility] of Occupational Health - Occupational Stress Questionnaire    Feeling of Stress : Not at all  Social Connections: Socially Integrated (04/13/2022)   Received from [facility], [facility]   Social Network    How would you rate your social network (family, work, friends)?: Good participation with social networks     Vital Signs: There were no vitals taken for this visit.  Physical Exam Patient is alert, oriented and able to participate fully in the  conversation. No apparent discomfort or distress observed. She appears appropriately dressed.   Imaging: R Knee MR 07/25/22    Bilateral knees 07/26/2018  Kellgren and Salena Craven grade 3 bilaterally, R>L  Right GAE (03/19/23)     Labs:  CBC: No results for input(s): "WBC", "HGB", "HCT", "PLT" in the last 8760 hours.  COAGS: No results for input(s): "INR", "APTT" in the last 8760 hours.  BMP: No results for input(s): "NA", "K", "CL", "CO2", "GLUCOSE", "BUN", "CALCIUM", "CREATININE", "GFRNONAA", "GFRAA" in the last 8760 hours.  Invalid input(s): "CMP"  LIVER FUNCTION TESTS: No results for input(s): "BILITOT", "AST", "ALT", "ALKPHOS", "PROT", "ALBUMIN" in the last 8760 hours.  Assessment and Plan: 58 year old female with a history of R>L primary osteoarthritis of both knees with severe, chronic pain. She was found to be an excellent candidate for geniculate artery embolization for pain control and the right knee was treated 03/19/23.  She reports significantly improved pain.  Her left knee is bothering her more now, and she is hoping to have it treated too which is reasonable.  Plan for left geniculate artery embolization at [facility] via left femoral antegrade approach, moderate  sedation.   Creasie Doctor, MD Pager: [phone]    I spent a total of 25 Minutes in virtual video clinical consultation, greater than 50% of which was counseling/coordinating care for bilateral knee pain.

## 2023-06-14 ENCOUNTER — Ambulatory Visit
Admission: RE | Admit: 2023-06-14 | Discharge: 2023-06-14 | Disposition: A | Discharge status: Home / Self Care | Payer: PRIVATE HEALTH INSURANCE | Source: Ambulatory Visit | Attending: Interventional Radiology | Admitting: Interventional Radiology

## 2023-06-14 DIAGNOSIS — M1711 Unilateral primary osteoarthritis, right knee: Secondary | ICD-10-CM

## 2023-06-14 HISTORY — PX: IR RADIOLOGIST EVAL & MGMT: IMG5224

## 2023-06-23 ENCOUNTER — Other Ambulatory Visit: Payer: Self-pay | Admitting: Interventional Radiology

## 2023-06-23 ENCOUNTER — Other Ambulatory Visit: Payer: Self-pay | Admitting: Sports Medicine

## 2023-06-23 DIAGNOSIS — M170 Bilateral primary osteoarthritis of knee: Secondary | ICD-10-CM

## 2023-06-23 DIAGNOSIS — M1712 Unilateral primary osteoarthritis, left knee: Secondary | ICD-10-CM

## 2023-06-28 ENCOUNTER — Telehealth: Payer: Self-pay

## 2023-06-28 NOTE — Telephone Encounter (Signed)
 Spoke with patient to remind her of her GAE at [facility] on 07/03/23 and confirmed her pharmacy in epic for Medrol  Dosepak Rx, which I called in. She stated an understanding not to take this until after procedure and to take as directed. She was reminded to have a driver, to have no food after midnight and to arrive at 0900.

## 2023-06-28 NOTE — Discharge Instructions (Signed)
  Discharge Instructions for Genicular Artery Embolization (GAE)   Post-Procedure Care   Activity:   Rest for the remainder of the day.   Avoid strenuous activities and heavy lifting for 48 hours.   Gradually resume normal activities as tolerated.   Pain Management:   You may experience mild pain or discomfort at the catheter insertion site or in the knee. This is normal.   Use over-the-counter pain relievers such as acetaminophen  (Tylenol ) or ibuprofen (Advil) as directed.   Apply an ice pack to the knee for 15-20 minutes every 2-3 hours to reduce swelling and discomfort.   Take Sol-Medrol  Pack as directed per pharmacy.   Wound Care:   Keep the catheter insertion site clean and dry.   Remove the bandage after 24 hours and replace it with a clean, dry bandage if needed.   Avoid soaking in baths, hot tubs, or swimming pools for 5 days. Showers are allowed.   Medications:   Take prescribed medications as directed.   If you were taking blood thinners, follow your physician's instructions on when to resume them.   Diet:   Resume your normal diet.   Drink plenty of fluids to stay hydrated.   Follow-Up:   Schedule a follow-up appointment with your physician as instructed.   Contact your physician if you experience increased pain, swelling, redness, or drainage at the insertion site, or if you have a fever over 100.54F (38C).   When to Seek Immediate Medical Attention   Call [phone] with any concerns:   Signs of infection at the catheter site (redness, warmth, pus).   Sudden weakness or numbness in the leg.   If you need to speak to someone after business hours (5:00pm), please call the on-call Interventional Radiologist at [phone]. Tell them you are a patient of Dr. Aleen Huron and you had a GAE today, along with any issues you are having.      Call 911 if:   Difficulty breathing or chest pain.   You have severe pain in your abdomen, and it does not  get better with medicine.     You have leg pain or leg swelling.   You feel dizzy, or you faint.   Do not wait to see if the symptoms will go away.   Do not drive yourself to the hospital.   Please ensure you follow these instructions carefully and reach out to your healthcare provider if you have any concerns or questions. Wishing you a smooth and speedy recovery!

## 2023-06-30 NOTE — H&P (Shared)
 Chief Complaint: Patient was seen in consultation today for left knee pain.   Referring Physician(s): Gean Keels   Supervising Physician: Creasie Doctor  Patient Status: DRI Beverly Jackson - Outpatient   History of Present Illness: Beverly Jackson is a 58 y.o. female with a medical history significant for HTN, anxiety, obesity currently taking Ozempic , lumbar radiculopathy and left breast cancer s/p lumpectomy with radiation therapy in 2019. She completed her 5 years of anti-endocrine therapy May 2024. She also has a history of primary osteoarthritis of both knees with severe, chronic pain. She has failed conservative management which has included physical therapy, numerous steroid injections and the Orthovisc series in July 2024. She was referred to our team to discuss geniculate artery embolization and she met with me in consultation 01/20/23. 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 She is hesitant to pursue arthroplasty given the time she would have to take away from work in recovery.   She met with Dr. Jinx Jackson 01/20/23 and they discussed the rationale, periprocedural expectations including risks and benefits, and long-term expectations of geniculate artery embolization. They discussed treating the right side followed possibly by the left at a later date. She was in agreement with this plan and she presented 03/19/23 for a right geniculate artery embolization. She tolerated the procedure well and was discharged home the same day.   She followed up with Dr. Jinx Jackson 06/14/23 and reported being very pleased with the outcome of her right GAE. 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 She expressed a desire to proceed with treatment of her left knee.   Past Medical History:  Diagnosis Date   Anxiety    Cancer (HCC)    Chest pain    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   Edema,  lower extremity    Gallbladder disease    Hypertension    Joint pain    Low blood sugar    MVP (mitral valve prolapse)    Obesity    Osteoarthritis    Sleep apnea    Thyroid disease    Vitamin D  deficiency     Past Surgical History:  Procedure Laterality Date   ABDOMINAL HYSTERECTOMY     BREAST SURGERY     CESAREAN SECTION     CHOLECYSTECTOMY     IR EMBO ARTERIAL NOT HEMORR HEMANG INC GUIDE ROADMAPPING  03/19/2023   IR RADIOLOGIST EVAL & MGMT  01/20/2023   IR RADIOLOGIST EVAL & MGMT  06/14/2023    Allergies: Penicillins and Short ragweed pollen ext  Medications: Prior to Admission medications   Medication Sig Start Date End Date Taking? Authorizing Provider  albuterol  (VENTOLIN  HFA) 108 (90 Base) MCG/ACT inhaler Inhale 2 puffs into the lungs every 6 (six) hours as needed for wheezing or shortness of breath. 02/26/23   Cherre Cornish, NP  chlorpheniramine-HYDROcodone  (TUSSIONEX) 10-8 MG/5ML Take 5 mLs by mouth every 12 (twelve) hours as needed for cough. 02/22/23   Stephany Ehrich, MD  doxycycline  (VIBRAMYCIN ) 100 MG capsule Take 1 capsule (100 mg total) by mouth 2 (two) times daily. 02/22/23   Stephany Ehrich, MD  furosemide  (LASIX ) 20 MG tablet TAKE 1 TABLET BY MOUTH DAILY 04/01/23   Gean Keels, MD  levothyroxine  (SYNTHROID ) 50 MCG tablet TAKE ONE TABLET BY MOUTH DAILY 06/25/22   Gean Keels, MD  meloxicam  (MOBIC ) 15 MG tablet APPOINTMENT  FOR FURTHER REFILLS. TAKE 1 TABLET BY MOUTH DAILY WITH MEALS FOR 2 WEEKS. THEN TAKE 1 TABLET BY MOUTH DAILY AS NEEDED FOR PAIN 06/23/23   Gean Keels, MD  methylPREDNISolone  (MEDROL  DOSEPAK) 4 MG TBPK tablet Taper Per Instructions 03/18/23   Federico Hopkins, MD  predniSONE  (DELTASONE ) 50 MG tablet Take 1 tablet (50 mg total) by mouth daily with breakfast. 02/26/23   Cherre Cornish, NP  sertraline  (ZOLOFT ) 100 MG tablet TAKE 1 TABLET BY MOUTH DAILY 11/13/22   Gean Keels, MD  spironolactone  (ALDACTONE ) 50 MG  tablet Take 1 tablet (50 mg total) by mouth daily. 07/14/22   Gean Keels, MD  Vitamin D , Ergocalciferol , (DRISDOL ) 1.25 MG (50000 UNIT) CAPS capsule Take 1 capsule by mouth once a week. 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  Tobacco Use   Smoking status: Never   Smokeless tobacco: Never  Vaping Use   Vaping status: Never Used  Substance and Sexual Activity   Alcohol use: Never   Drug use: Never   Sexual activity: Never  Other Topics Concern   Not on file  Social History Narrative   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, Hairdresser   Social Drivers of Health   Financial Resource Strain: Low Risk  (04/13/2022)   Received [facility], [facility]   Overall Financial Resource Strain (CARDIA)    Difficulty of Paying Living Expenses: Not hard at all  Food Insecurity: No Food Insecurity (04/13/2022)   Received from [facility], [facility]   Hunger Vital Sign    Worried About Running Out of Food in the Last Year: Never true    Ran Out of Food in the Last Year: Never true  Transportation Needs: No Transportation Needs (07/13/2022)   PRAPARE - Administrator, Civil Service (Medical): No    Lack of Transportation (Non-Medical): No  Physical Activity: Unknown (04/13/2022)   Received from [facility], [facility]   Exercise Vital Sign    Days of Exercise per Week: 0 days    Minutes of Exercise per Session: Not  on file  Stress: No Stress Concern Present (04/13/2022)   Received from [facility], [facility] - Occupational Stress Questionnaire    Feeling of Stress : Not at all  Social Connections: Socially Integrated (04/13/2022)   Received from [facility], [facility]   Social Network    How would you rate your social network (family, work, friends)?: Good participation with social networks    Review of Systems: A 12 point ROS discussed and pertinent positives are indicated in the HPI above.  All other systems are negative.  Review of Systems  Vital Signs: There were no vitals taken for this visit.  Physical Exam  Imaging:  R Knee MR 07/25/22    Bilateral knees 07/26/2018  Kellgren and Salena Craven grade 3 bilaterally, R>L   Right GAE (03/19/23)       Labs:  CBC: No results for input(s): "WBC", "HGB", "HCT", "PLT" in the last 8760 hours.  COAGS: No results for input(s): "INR", "APTT" in the last 8760 hours.  BMP: No results for input(s): "NA", "K", "CL", "CO2", "GLUCOSE", "BUN", "CALCIUM", "CREATININE", "GFRNONAA", "GFRAA" in the last 8760 hours.  Invalid input(s): "CMP"  LIVER FUNCTION TESTS: No results for input(s): "BILITOT", "AST", "ALT", "ALKPHOS", "PROT", "ALBUMIN" in the last 8760 hours.  TUMOR MARKERS: No results for input(s): "AFPTM", "CEA", "CA199", "CHROMGRNA" in the last 8760 hours.  Assessment and Plan:  Left knee pain: Beverly Jackson, 58 year old female, presents today for an image-guided left geniculate artery embolization.   Risks and benefits of this procedure were discussed with the patient including, but not limited to bleeding, infection, vascular injury or contrast induced renal failure.  All of the patient's questions were answered, patient is agreeable to proceed. She has been NPO.   Consent signed and in chart.  Thank you for this interesting consult.  I greatly enjoyed meeting Beverly Jackson and look  forward to participating in their care.  A copy of this report was sent to the requesting provider on this date.  Electronically Signed: Jetta Morrow, AGACNP-BC 06/30/2023, 2:25 PM   I spent a total of  30 Minutes   in face to face in clinical consultation, greater than 50% of which was counseling/coordinating care for left knee pain.

## 2023-07-02 ENCOUNTER — Inpatient Hospital Stay
Admission: RE | Admit: 2023-07-02 | Discharge: 2023-07-02 | Disposition: A | Discharge status: Home / Self Care | Payer: PRIVATE HEALTH INSURANCE | Source: Ambulatory Visit | Attending: Interventional Radiology | Admitting: Interventional Radiology

## 2023-07-03 ENCOUNTER — Other Ambulatory Visit: Payer: Self-pay | Admitting: Sports Medicine

## 2023-07-05 ENCOUNTER — Ambulatory Visit: Payer: Self-pay

## 2023-07-05 NOTE — Telephone Encounter (Addendum)
 Summary: Lethargic, horsiness, cough,  (2 days)   Copied From CRM [id number]. Reason for Triage: Lethargic, horsiness, cough,  (2 days     07/05/23 1:50pm- 1st attempt, no answer "Call could not be completed as dialed"  07/05/23 3:00pm- 2nd attempt, no answer. "Call could be completed as dialed"; will go ahead and route to clinic for f/u  due to nature of call. Also, pt's PCP is Dr. Elva Hamburger, so clinic has to provide schedule.

## 2023-07-06 ENCOUNTER — Ambulatory Visit (INDEPENDENT_AMBULATORY_CARE_PROVIDER_SITE_OTHER): Payer: PRIVATE HEALTH INSURANCE | Admitting: Sports Medicine

## 2023-07-06 DIAGNOSIS — J111 Influenza due to unidentified influenza virus with other respiratory manifestations: Secondary | ICD-10-CM | POA: Diagnosis not present

## 2023-07-06 DIAGNOSIS — J029 Acute pharyngitis, unspecified: Secondary | ICD-10-CM | POA: Insufficient documentation

## 2023-07-06 LAB — POC COVID19 BINAXNOW: SARS Coronavirus 2 Ag: NEGATIVE

## 2023-07-06 LAB — POCT INFLUENZA A/B
Influenza A, POC: NEGATIVE
Influenza B, POC: NEGATIVE

## 2023-07-06 NOTE — Addendum Note (Signed)
 Addended by: Montgomery Apgar on: 07/06/2023 11:49 AM   Modules accepted: Orders

## 2023-07-06 NOTE — Assessment & Plan Note (Addendum)
 Very pleasant 58 year old female with multiple medical problems, for 2 days she has had fatigue, cough, malaise, hoarseness. No dysuria or vaginal discharge. 1 episode of vomiting but this was due to taking over-the-counter medication on an empty stomach, no further vomiting or diarrhea. Mild cough. On exam she appears unwell Head and neck exam is normal with the exception of a dislodged tympanostomy tube in the left canal, right eardrum shows severe sclerosis. No evidence of infection. Lungs are clear. COVID and flu testing is negative, I have advised symptomatic treatment for now, she can call me if not better in a week.

## 2023-07-06 NOTE — Progress Notes (Signed)
    Procedures performed today:    None.  Independent interpretation of notes and tests performed by another provider:   None.  Brief History, Exam, Impression, and Recommendations:    Influenza-like illness Very pleasant 58 year old female with multiple medical problems, for 2 days she has had fatigue, cough, malaise, hoarseness. No dysuria or vaginal discharge. 1 episode of vomiting but this was due to taking over-the-counter medication on an empty stomach, no further vomiting or diarrhea. Mild cough. On exam she appears unwell Head and neck exam is normal with the exception of a dislodged tympanostomy tube in the left canal, right eardrum shows severe sclerosis. No evidence of infection. Lungs are clear. COVID and flu testing is negative, I have advised symptomatic treatment for now, she can call me if not better in a week.  I spent 30 minutes of total time managing this patient today, this includes chart review, face to face, and non-face to face time.  ____________________________________________ Joselyn Nicely. Sandy Crumb, M.D., ABFM., CAQSM., AME. Primary Care and Sports Medicine [facility]  Adjunct Professor of [facility] Medicine  [employer]  Restaurant manager, fast food

## 2023-07-08 ENCOUNTER — Ambulatory Visit: Payer: PRIVATE HEALTH INSURANCE

## 2023-07-08 ENCOUNTER — Encounter: Payer: Self-pay | Admitting: Sports Medicine

## 2023-07-08 DIAGNOSIS — J111 Influenza due to unidentified influenza virus with other respiratory manifestations: Secondary | ICD-10-CM

## 2023-07-08 DIAGNOSIS — J029 Acute pharyngitis, unspecified: Secondary | ICD-10-CM

## 2023-07-08 DIAGNOSIS — R1310 Dysphagia, unspecified: Secondary | ICD-10-CM | POA: Diagnosis not present

## 2023-07-08 MED ORDER — PREDNISONE 50 MG PO TABS: 50.0000 mg | ORAL_TABLET | Freq: Every day | ORAL | 0 refills | Status: DC

## 2023-07-08 MED ORDER — AZITHROMYCIN 250 MG PO TABS: ORAL_TABLET | ORAL | 0 refills | Status: DC

## 2023-07-09 ENCOUNTER — Ambulatory Visit: Payer: Self-pay | Admitting: Sports Medicine

## 2023-07-20 ENCOUNTER — Other Ambulatory Visit: Payer: Self-pay | Admitting: Sports Medicine

## 2023-08-24 ENCOUNTER — Other Ambulatory Visit: Payer: Self-pay | Admitting: Sports Medicine

## 2023-08-24 DIAGNOSIS — M170 Bilateral primary osteoarthritis of knee: Secondary | ICD-10-CM

## 2023-08-24 NOTE — ED Provider Notes (Signed)
 ------------------------------------------------------------------------------- Attestation signed by Lynwood JAYSON Life, MD at 08/28/23 1900 I have reviewed and agree with the APP's findings and plan for this patient. Lynwood JAYSON Life, MD Emergency Department - 08/28/2023 7:00 PM  -------------------------------------------------------------------------------   [facility]  ED Provider Note  Beverly Jackson 58 y.o. female DOB: 04-09-1965 MRN: [id number] History   Chief Complaint  Patient presents with  . Tachycardia    Pt sent by oncology for tachycardia. Pt states HR was 118 at office and they sent her to rule out PE. Pt reports fatigue, denies shob. Hx of breast cancer, currently in remission, was there today for annual checkup. HR 90s in triage.   58 year old female presents for evaluation episodic tachycardia that was noted while she was at her oncologist for her 1 year reevaluation.  She states she was sent here by her oncologist to rule out a PE.  She does have a history of cancer but is in remission.  She denies chest pain, shortness of breath, palpitations, diaphoresis, dizziness, abdominal pain, nausea, vomiting or diarrhea.  She has had no lower extremity injury or recent change to a more sedentary lifestyle.  She denies recent long trips in a car or airplane.  She does not smoke and is not on birth control.   History provided by:  Patient Language interpreter used: No   Tachycardia Palpitations quality:  Regular Onset quality:  Unable to specify Context: anxiety   Context: not illicit drugs and not stimulant use   Associated symptoms: no back pain, no chest pain, no chest pressure, no cough, no diaphoresis, no dizziness, no leg pain, no malaise/fatigue, no nausea, no near-syncope, no orthopnea, no shortness of breath, no syncope, no vomiting and no weakness   Risk factors: no hx of DVT and no hx of PE        Past Medical History:  Diagnosis  Date  . Acute superficial venous thrombosis of lower extremity, right 04/2020   right greater saphenous vein  . Allergy    SEASONAL  . 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 STATES STILL HAVING THIS PROBLEM(AS OF 04/26/17). PATIENT EXPRESSED GREAT CONCERNS RE: ANESTHESIA.   SABRA Arthritis    KNEES  . Breast cancer (*) 2019   left  . Cancer (*) 2019   BREAST (LEFT)  . Class 3 severe obesity in adult 05/13/2017  . COVID-19   . 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 Edema    TO FEET AND LEGS DUE TO STANDING ALL DAY FOR WORK  . GI bleed due to NSAIDs    was taking 12 ibuprofen a day due to dental problem   . Heart murmur   . MVP (mitral valve prolapse)   . PONV (postoperative nausea and vomiting)    NAUSEA  . Sleep apnea with use of continuous positive airway pressure (CPAP)    CPAP COMPLIANT  . Thyroid disease    hypo  . Vitamin D  deficiency 07/18/2019    Past Surgical History:  Procedure Laterality Date  . Breast biopsy Left 03/29/2017   stereo bx ca++  . Breast lumpectomy    . Cesarean section  1996  . Cesarean section  1999  . Cholecystectomy     laparoscopic  11/12  . Hysterectomy  2006  . Retropubic sling N/A 09/01/2021  . Wisdom tooth extraction      Social History  Substance and Sexual Activity  Alcohol Use No   Tobacco Use History[1] E-Cigarettes  . Vaping Use Never User   . Start Date    . Cartridges/Day    . Quit Date     Social History   Substance and Sexual Activity  Drug Use No         Allergies[2]  Discharge Medication List as of 08/24/2023  7:52 PM     CONTINUE these medications which have NOT CHANGED   Details  ergocalciferol  (VITAMIN D2) 50,000 units CAPS capsule TAKE 1 CAPSULE BY MOUTH ONCE WEEKLY, Normal    furosemide  (LASIX ) 20 mg tablet Take one tablet (20 mg dose) by mouth every morning., Historical Med     levothyroxine  sodium (SYNTHROID ,LEVOTHROID,LOVOXYL) 50 mcg tablet Take one tablet (50 mcg dose) by mouth daily., Starting Tue 06/24/2021, Historical Med    !! meloxicam  (MOBIC ) 15 mg tablet Take one tablet (15 mg dose) by mouth every morning., Starting Wed 06/23/2023, Historical Med    !! meloxicam  (MOBIC ) 7.5 mg tablet Take one tablet (7.5 mg dose) by mouth daily., Historical Med    sertraline  (ZOLOFT ) 100 mg tablet Take one tablet (100 mg dose) by mouth every morning., Starting Wed 10/29/2021, Historical Med    spironolactone  (ALDACTONE ) 50 MG tablet Take one tablet (50 mg dose) by mouth every morning., Historical Med    tamoxifen (NOLVADEX) 20 MG tablet TAKE ONE TABLET BY MOUTH DAILY, Starting Mon 03/30/2022, Normal     !! - Potential duplicate medications found. Please discuss with provider.      Primary Survey  Primary Survey  Review of Systems   Review of Systems  Constitutional:  Negative for diaphoresis and malaise/fatigue.  Respiratory:  Negative for cough and shortness of breath.   Cardiovascular:  Negative for chest pain, orthopnea, syncope and near-syncope.  Gastrointestinal:  Negative for nausea and vomiting.  Musculoskeletal:  Negative for back pain.  Neurological:  Negative for dizziness.  Refer to HPI for review of systems. Unless otherwise noted, the patient's 5-point ROS is negative.  Physical Exam   ED Triage Vitals [08/24/23 1652]  BP (!) 141/93  Heart Rate 94  Resp 16  SpO2 96 %  Temp 99 F (37.2 C)    Physical Exam  Nursing note and vitals reviewed. Constitutional: She appears well-developed and well-nourished. She does not appear distressed and does not appear ill.  HENT:  Head: Normocephalic and atraumatic.  Right Ear: Normal external ear.  Left Ear: Normal external ear.  Nose: Nose normal.  Eyes: EOM are intact. Conjunctivae are normal. Pupils are equal, round, and reactive to light.  Neck: Normal range of motion. Neck supple.  Cardiovascular:  Normal rate, regular rhythm, normal heart sounds and normal pulses.  Pulmonary/Chest: Respiratory effort normal and breath sounds normal.  Abdominal: Soft. There is no abdominal tenderness. Bowel sounds are normal.  Musculoskeletal: Normal range of motion.     Cervical back: Normal range of motion and neck supple.   Neurological: She is alert and oriented to person, place, and time.  Skin: Skin is warm. Skin is dry.  Psychiatric: She has a normal mood and affect. Her behavior is normal. Judgment and thought content normal.     ED Course   Lab results:   COMPREHENSIVE METABOLIC PANEL - Abnormal      Result Value   Na 140     Potassium 4.1     Cl 101     CO2 25     AGAP 14  Glucose 155 (*)    BUN 20     Creatinine 0.88     Ca 10.3 (*)    ALK PHOS 138     T Bili 0.3     Total Protein 7.9     Alb 4.6     GLOBULIN 3.3     ALBUMIN/GLOBULIN RATIO 1.4     BUN/CREAT RATIO 22.7     ALT 55 (*)    AST 35     eGFR 78     Comment: Normal GFR (glomerular filtration rate) > 60 mL/min/1.73 meters squared, < 60 may include impaired kidney function. Calculation based on the Chronic Kidney Disease Epidemiology Collaboration (CK-EPI)equation refit without adjustment for race.  GEN5 CARDIAC TROPONIN T (TNT5) BASELINE - Normal   TnT-Gen5 (0hr) 7     Comment: An elevated Troponin indicates myocardial damage. Elevated troponin may also be due to pulmonary emboli, aortic dissection, heart failure, trauma, toxins and ischemia in the setting of critical illness.   MAGNESIUM - Normal   Mg 2.1    D-DIMER, QUANTITATIVE - Normal   D-Dimer 0.36     Comment: The results of D-Dimer testing should be evaluated in the context of all clinical and laboratory data available. D-Dimer cut off value is 0.50 ug/ml FEU. D-Dimer assay can be used to exclude DVT & PE, but should not be used to exclude patients with: - Therapeutic dose anticoagulant therapy for >24 hours.  - Fibrinolytic therapy within previous  7 days  - Trauma or surgery within previous 4 weeks  - Disseminated malignancies  - Aortic aneurysm  - Sepsis, severe infections, pneumonia, severe skin infections  - Liver cirrhosis  - Pregnancy   CBC AND DIFFERENTIAL   WBC 8.1     RBC 5.17     HGB 14.5     HCT 44.8     MCV 86.7     MCH 28.0     MCHC 32.4     Plt Ct 255     RDW SD 39.9     MPV 10.0     NRBC% 0.0     Absolute NRBC Count 0.00     NEUTROPHIL % 69.0     LYMPHOCYTE % 20.7     MONOCYTE % 8.9     Eosinophil % 0.7     BASOPHIL % 0.5     IG% 0.2     ABSOLUTE NEUTROPHIL COUNT 5.57     ABSOLUTE LYMPHOCYTE COUNT 1.67     Absolute Monocyte Count 0.72     Absolute Eosinophil Count 0.06     Absolute Basophil Count 0.04     Absolute Immature Granulocyte Count 0.02    LIGHT BLUE TOP  GOLD SST    Imaging:   XR CHEST AP/PA   Narrative:    Single view of the chest.  HISTORY: Chest pain.  Comparison is made to 08/06/2020.  There is no acute infiltrate.  There is no pleural effusion.  The cardiac silhouette is unremarkable.  The visualized osseous structures demonstrate no gross abnormality.    Impression:    IMPRESSION:  No acute pulmonary disease.  Electronically Signed by: Marinda Fleming, MD on 08/24/2023 6:04 PM      ECG: ECG Results          ECG 12 lead (Final result)  Result time 08/24/23 19:50:15    Final result             Narrative:   Diagnosis Class Normal Acquisition  Device D3K Ventricular Rate 87 Atrial Rate 87 P-R Interval 162 QRS Duration 88 Q-T Interval 374 QTC Calculation(Bazett) 450 Calculated P Axis 76 Calculated R Axis -22 Calculated T Axis 63  Diagnosis Normal sinus rhythm Normal ECG When compared with ECG of 24-Aug-2023 16:47, No significant change was found No acute injury pattern Brien Kung ([id number]) on 08/24/2023 7:50:06 PM certifies that he/she has reviewed the ECG tracing and confirms the independent  interpretation is correct.                          ECG 12 lead (Final result)  Result time 08/24/23 19:49:51    Final result             Narrative:   Diagnosis Class Abnormal Acquisition Device D3K Ventricular Rate 96 Atrial Rate 96 P-R Interval 166 QRS Duration 86 Q-T Interval 356 QTC Calculation(Bazett) 449 Calculated P Axis 69 Calculated R Axis -34 Calculated T Axis 65  Diagnosis Normal sinus rhythm Left axis deviation Possible Anterior infarct , age undetermined Abnormal ECG When compared with ECG of 06-Aug-2020 23:10, No significant change was found No acute injury pattern Brien Kung ([id number]) on 08/24/2023 7:49:42 PM certifies that he/she has reviewed the ECG tracing and confirms the independent  interpretation is correct.                            HEAR Score History: Mostly low risk features ECG: Normal Age: 50-64 yrs Risk Factors: 1 or 2 risk factors HEAR Score Total: 2                                                        Pre-Sedation Procedures  ED Course as of 08/26/23 1754  Ozell Beagle Craig's Documentation  Thu Aug 26, 2023  1743 TnT-Gen5 bernett): 7   Medical Decision Making DDx considerations include but are not limited to ACS, PE, CHF, syncope, dehydration, drug-induced orthostasis, dysautonomia, hemorrhage, hypotension, postural hypotension, subclavian steal, vasodepressor/vasovagal response, vasomotor insufficiency, anxiety or normal physiological response to exercise.   Heart rate within expected ranges on presentation.  She was asymptomatic.  Patient had an uneventful ED course with vital signs that remained stable and within expected ranges.  She was hemodynamically stable throughout her ED visit.  Labs unremarkable.  D-dimer was drawn which was negative.  CXR without acute cardiopulmonary process.  Serial ECG with NSR.  Plan: Patient will discharge home and will plan to follow-up with her PCM in the next few days.  Return precautions were  discussed with patient and she understands that she should return to the emergency room for worsening symptoms or any other urgent or emergent medical concerns.  Amount and/or Complexity of Data Reviewed Labs: ordered. Decision-making details documented in ED Course. Radiology: ordered. Decision-making details documented in ED Course. ECG/medicine tests: ordered. Decision-making details documented in ED Course.           Provider Communication  Discharge Medication List as of 08/24/2023  7:52 PM      Discharge Medication List as of 08/24/2023  7:52 PM      Discharge Medication List as of 08/24/2023  7:52 PM      Clinical Impression Final diagnoses:  Tachycardia  No problem, feared complaint unfounded  ED Disposition     ED Disposition  Discharge   Condition  Stable   Comment  --                 Follow-up Information     Debby JINNY Petties, MD. Schedule an appointment as soon as possible for a visit in 3 days.   Specialties: Family Medicine, Sports Medicine Contact information: [phone] [address] [place] [address] [phone]                  Electronically signed by:      [1] Social History Tobacco Use  Smoking Status Never  Smokeless Tobacco Never  Tobacco Comments   none 2.13.2020  [2] Allergies Allergen Reactions  . Other Itching  . Short Ragweed Pollen Ext Itching    Other reaction(s): Itching  . Penicillins Hives   Michael Eugene Craig, [place] 08/26/23 1754

## 2023-10-05 ENCOUNTER — Ambulatory Visit (INDEPENDENT_AMBULATORY_CARE_PROVIDER_SITE_OTHER): Payer: PRIVATE HEALTH INSURANCE | Admitting: Sports Medicine

## 2023-10-05 VITALS — BP 116/74 | HR 79

## 2023-10-05 DIAGNOSIS — G4733 Obstructive sleep apnea (adult) (pediatric): Secondary | ICD-10-CM

## 2023-10-05 DIAGNOSIS — M170 Bilateral primary osteoarthritis of knee: Secondary | ICD-10-CM | POA: Diagnosis not present

## 2023-10-05 DIAGNOSIS — Z0000 Encounter for general adult medical examination without abnormal findings: Secondary | ICD-10-CM | POA: Diagnosis not present

## 2023-10-05 MED ORDER — MELOXICAM 15 MG PO TABS: 15.0000 mg | ORAL_TABLET | Freq: Every day | ORAL | 1 refills | Status: DC | PRN

## 2023-10-05 NOTE — Assessment & Plan Note (Signed)
 Currently up-to-date on screenings, I have updated the health maintenance section, she does have a mammogram coming up later today.

## 2023-10-05 NOTE — Progress Notes (Signed)
    Procedures performed today:    None.  Independent interpretation of notes and tests performed by another provider:   None.  Brief History, Exam, Impression, and Recommendations:    Obstructive sleep apnea Obstructive sleep apnea, historically well-controlled with CPAP, her machine is 56 years old per her report, she is interested in a new machine with auto titrating pressure. We will do a referral to adult pediatric specialist in the hopes of not needing an additional sleep study.  Annual physical exam Currently up-to-date on screenings, I have updated the health maintenance section, she does have a mammogram coming up later today.    ____________________________________________ Debby PARAS. Curtis, M.D., ABFM., CAQSM., AME. Primary Care and Sports Medicine [facility]  Adjunct Professor of [facility] Medicine  [employer]  Restaurant manager, [employer]

## 2023-10-05 NOTE — Assessment & Plan Note (Signed)
 Obstructive sleep apnea, historically well-controlled with CPAP, her machine is 56 years old per her report, she is interested in a new machine with auto titrating pressure. We will do a referral to adult pediatric specialist in the hopes of not needing an additional sleep study.

## 2023-10-21 ENCOUNTER — Telehealth: Payer: Self-pay | Admitting: Sports Medicine

## 2023-10-21 DIAGNOSIS — G4733 Obstructive sleep apnea (adult) (pediatric): Secondary | ICD-10-CM

## 2023-10-21 NOTE — Telephone Encounter (Signed)
 Copied from CRM #[id number]. Topic: Clinical - Medical Advice >> Oct 21, 2023 12:18 PM Kevelyn M wrote: Reason for CRM: Received a Cpap order for this patient and didn't any sleep study notes prior to sleep study nor notes of the sleep study itself.  Call back #[phone]

## 2023-10-22 NOTE — Telephone Encounter (Signed)
 Orders Placed This Encounter  Procedures   Home sleep test    Needs recert for new CPAP machine.    Scheduling Instructions:     [facility]    Where should this test be performed::   Other    Please print and give to Tosha.

## 2023-10-22 NOTE — Telephone Encounter (Signed)
 Spoke with [name] at [facility], she states that they will need patient's sleep study results to proced with replacing her CPAP.   Spoke with Beverly Jackson, she reports her sleep study was approx 11 years ago and she cannot remember which company it was through. She is willing to repeat the study.   Please place order for a repeat sleep study.

## 2023-10-22 NOTE — Telephone Encounter (Signed)
 Printed and given to Tosha [place]

## 2023-10-26 ENCOUNTER — Encounter: Payer: Self-pay | Admitting: Sports Medicine

## 2023-10-26 NOTE — Telephone Encounter (Signed)
 A sleep study has been ordered.

## 2023-10-26 NOTE — Telephone Encounter (Signed)
 Copied from CRM #[id number]. Topic: Clinical - Medical Advice >> Oct 21, 2023 12:18 PM Kevelyn M wrote: Reason for CRM: Received a Cpap order for this patient and didn't any sleep study notes prior to sleep study nor notes of the sleep study itself.  Call back #[phone]

## 2023-11-02 NOTE — Telephone Encounter (Signed)
 Unable to located previous sleep study. Patient is requesting new sleep machine. Patient will need new sleep study. Left message on patients voicemail requesting information.

## 2023-11-02 NOTE — Telephone Encounter (Signed)
 Can you write orders for home sleep study?

## 2023-11-03 ENCOUNTER — Telehealth: Payer: Self-pay | Admitting: Family Medicine

## 2023-11-03 NOTE — Addendum Note (Signed)
 Addended byBETHA WILLO MINI on: 11/03/2023 02:42 PM   Modules accepted: Orders

## 2023-11-03 NOTE — Telephone Encounter (Signed)
 Copied from CRM [id number]. Topic: General - Other >> Nov 03, 2023  2:24 PM Lauren C wrote: Reason for CRM: Pt returning call about which doctor she had her sleep study done with. She says it is Dr Katheryn Garrison, but she is not sure if she is still practicing. Per google it looks like she is practicing at [facility].

## 2023-11-04 ENCOUNTER — Telehealth: Payer: Self-pay

## 2023-11-04 NOTE — Telephone Encounter (Signed)
 Forwarding message to Beverly Jackson covering Beverly Jackson  Found a note from patient received yesterday that she thought sleep study was with Beverly Jackson ( now Beverly Jackson)  With [facility] fax# [phone] Called their office and they no longer have  the patient in their system  Spoke with patient and was told that she had last had sleep study 11 years ago.   Sleep study ordered yesterday by Beverly Jackson   Called  [facility] back and informed them that we had ordered the sleep study yesterday and we would be able to fax this and chart notes once this is received.  Also informed her of need to schedule with new PCP as Beverly Jackson no longer in our office.  Gave her provider's names who are accepting  new patients. She will research them and get back to us  on who she would like to schedule with.

## 2023-11-04 NOTE — Telephone Encounter (Signed)
 Copied from CRM #[id number]. Topic: General - Other >> Nov 03, 2023 10:47 AM Antwanette L wrote: Reason for CRM: Pt is returning a missed call from the office.  Dr. Curtis wants to know where the pt had her sleep test and the pt said will call back w/ that info >> Nov 03, 2023 11:50 AM Susanna ORN wrote: [name], with [facility] calling about Sleepy study. Please give her a call back at [phone].

## 2023-11-04 NOTE — Telephone Encounter (Signed)
 Called this office and found that only  visit was greater than 8 years ago and they no longer have these records.  Spoke with patient and informed of this. Another sleep study was ordered by Zada palin .

## 2023-11-08 ENCOUNTER — Telehealth: Payer: Self-pay

## 2023-11-08 NOTE — Telephone Encounter (Signed)
 Copied from CRM #[id number]. Topic: General - Other >> Nov 03, 2023 10:47 AM Antwanette L wrote: Reason for CRM: Pt is returning a missed call from the office.  Dr. Curtis wants to know where the pt had her sleep test and the pt said will call back w/ that info >> Nov 08, 2023 10:49 AM Susanna ORN wrote: [name], with [facility], calling to confirm if order for sleep study was placed on hold. Called [facility] & spoke with nurse Jon. Warm transferred.  >> Nov 03, 2023 11:50 AM Susanna ORN wrote: [name], with [facility] calling about Sleepy study. Please give her a call back at [phone].

## 2023-11-08 NOTE — Telephone Encounter (Signed)
 Beverly Jackson has spoken with [name]  with [facility]

## 2023-11-11 NOTE — Telephone Encounter (Signed)
 Sleep study orders, clinical notes, demographics, and copies of insurance cards have been faxed to [facility] at [phone] on 11/03/2023 and 11/11/2023. Office will contact patient to complete online registration in order to ship home sleep study equipment.

## 2023-12-07 ENCOUNTER — Other Ambulatory Visit: Payer: Self-pay

## 2023-12-07 DIAGNOSIS — M170 Bilateral primary osteoarthritis of knee: Secondary | ICD-10-CM

## 2023-12-07 DIAGNOSIS — F419 Anxiety disorder, unspecified: Secondary | ICD-10-CM

## 2023-12-07 MED ORDER — SERTRALINE HCL 100 MG PO TABS: 100.0000 mg | ORAL_TABLET | Freq: Every day | ORAL | 0 refills | Status: DC

## 2023-12-07 MED ORDER — MELOXICAM 15 MG PO TABS: 15.0000 mg | ORAL_TABLET | Freq: Every day | ORAL | 0 refills | Status: DC | PRN

## 2024-01-04 ENCOUNTER — Other Ambulatory Visit: Payer: Self-pay | Admitting: Medical-Surgical

## 2024-01-04 ENCOUNTER — Telehealth: Payer: Self-pay | Admitting: Sports Medicine

## 2024-01-04 DIAGNOSIS — F32A Depression, unspecified: Secondary | ICD-10-CM

## 2024-01-04 DIAGNOSIS — M170 Bilateral primary osteoarthritis of knee: Secondary | ICD-10-CM

## 2024-01-04 NOTE — Telephone Encounter (Signed)
 Pt contacted to schedule an TOC appointment with new PCP since Dr. ONEIDA has left the office. PT states she recently had a shunt placed by [facility] provider and would like to transfer care to [facility].

## 2024-01-11 ENCOUNTER — Telehealth: Payer: Self-pay | Admitting: Sports Medicine

## 2024-01-11 ENCOUNTER — Other Ambulatory Visit: Payer: Self-pay | Admitting: Medical-Surgical

## 2024-01-11 MED ORDER — LEVOTHYROXINE SODIUM 50 MCG PO TABS: 50.0000 ug | ORAL_TABLET | Freq: Every day | ORAL | 0 refills | Status: AC

## 2024-01-11 NOTE — Telephone Encounter (Unsigned)
 Copied from CRM [id number]. Topic: Clinical - Medication Refill >> Jan 11, 2024 12:56 PM Lauren C wrote: Medication: levothyroxine  (SYNTHROID ) 50 MCG tablet  Has the patient contacted their pharmacy? Yes Pharmacy is calling on patient's behalf because they are having trouble with faxes.   This is the patient's preferred pharmacy:  [facility] [id number] - [place], [place] - [address]  [place] [address] Phone: [phone] Fax: [phone]  Is this the correct pharmacy for this prescription? Yes If no, delete pharmacy and type the correct one.   Has the prescription been filled recently? Yes  Is the patient out of the medication? Unknown  Has the patient been seen for an appointment in the last year OR does the patient have an upcoming appointment? Yes, last seen 8/12 with Dr. ONEIDA  Can we respond through MyChart? No  Agent: Please be advised that Rx refills may take up to 3 business days. We ask that you follow-up with your pharmacy.

## 2024-01-11 NOTE — Telephone Encounter (Signed)
 Copied from CRM [id number]. Topic: Appointments - Scheduling Inquiry for Clinic >> Jan 11, 2024  1:00 PM Lauren C wrote: Reason for CRM: Patient requesting med refill through pharmacy and pharmacy called on her behalf. She is needing to make TOC appt with Zada Palin since she is still established with Dr. ONEIDA. [id number] unable to pull TOC appt type with [place]. Please give pt a call back for scheduling.  Please advise

## 2024-02-05 ENCOUNTER — Other Ambulatory Visit: Payer: Self-pay | Admitting: Medical-Surgical

## 2024-03-17 ENCOUNTER — Other Ambulatory Visit: Payer: Self-pay | Admitting: Medical-Surgical

## 2024-03-24 ENCOUNTER — Other Ambulatory Visit: Payer: Self-pay | Admitting: Medical-Surgical

## 2024-03-24 DIAGNOSIS — M170 Bilateral primary osteoarthritis of knee: Secondary | ICD-10-CM
